# Patient Record
Sex: Female | Born: 1955 | Race: White | Hispanic: No | Smoking: Former smoker
Health system: Southern US, Community
[De-identification: ages and names within clinical notes are randomized; demographics above are authoritative.]

## PROBLEM LIST (undated history)

## (undated) DIAGNOSIS — R42 Dizziness and giddiness: Secondary | ICD-10-CM

## (undated) DIAGNOSIS — R0602 Shortness of breath: Secondary | ICD-10-CM

## (undated) DIAGNOSIS — R112 Nausea with vomiting, unspecified: Secondary | ICD-10-CM

## (undated) DIAGNOSIS — K219 Gastro-esophageal reflux disease without esophagitis: Secondary | ICD-10-CM

## (undated) DIAGNOSIS — E7800 Pure hypercholesterolemia, unspecified: Secondary | ICD-10-CM

## (undated) DIAGNOSIS — R002 Palpitations: Secondary | ICD-10-CM

## (undated) DIAGNOSIS — F419 Anxiety disorder, unspecified: Secondary | ICD-10-CM

## (undated) DIAGNOSIS — G8929 Other chronic pain: Secondary | ICD-10-CM

## (undated) DIAGNOSIS — C9000 Multiple myeloma not having achieved remission: Secondary | ICD-10-CM

## (undated) DIAGNOSIS — M1990 Unspecified osteoarthritis, unspecified site: Secondary | ICD-10-CM

## (undated) DIAGNOSIS — M549 Dorsalgia, unspecified: Secondary | ICD-10-CM

## (undated) DIAGNOSIS — I10 Essential (primary) hypertension: Secondary | ICD-10-CM

## (undated) DIAGNOSIS — C801 Malignant (primary) neoplasm, unspecified: Secondary | ICD-10-CM

## (undated) DIAGNOSIS — Z9889 Other specified postprocedural states: Secondary | ICD-10-CM

## (undated) DIAGNOSIS — D649 Anemia, unspecified: Secondary | ICD-10-CM

## (undated) DIAGNOSIS — R000 Tachycardia, unspecified: Secondary | ICD-10-CM

## (undated) DIAGNOSIS — Z780 Asymptomatic menopausal state: Secondary | ICD-10-CM

## (undated) DIAGNOSIS — E669 Obesity, unspecified: Secondary | ICD-10-CM

## (undated) DIAGNOSIS — G709 Myoneural disorder, unspecified: Secondary | ICD-10-CM

## (undated) DIAGNOSIS — Z5189 Encounter for other specified aftercare: Secondary | ICD-10-CM

## (undated) HISTORY — PX: SPINE SURGERY: SHX786

## (undated) HISTORY — PX: TONSILLECTOMY: SHX5217

## (undated) HISTORY — PX: TONSILLECTOMY: SUR1361

## (undated) HISTORY — DX: Dorsalgia, unspecified: M54.9

## (undated) HISTORY — DX: Encounter for other specified aftercare: Z51.89

## (undated) HISTORY — PX: BACK SURGERY: SHX140

## (undated) HISTORY — PX: CHOLECYSTECTOMY: SHX55

## (undated) HISTORY — PX: ABDOMINAL HYSTERECTOMY: SHX81

## (undated) HISTORY — DX: Pure hypercholesterolemia, unspecified: E78.00

## (undated) HISTORY — DX: Obesity, unspecified: E66.9

## (undated) HISTORY — PX: OTHER SURGICAL HISTORY: SHX169

## (undated) HISTORY — DX: Anemia, unspecified: D64.9

## (undated) HISTORY — DX: Palpitations: R00.2

## (undated) HISTORY — DX: Asymptomatic menopausal state: Z78.0

## (undated) HISTORY — DX: Tachycardia, unspecified: R00.0

## (undated) HISTORY — DX: Myoneural disorder, unspecified: G70.9

## (undated) HISTORY — DX: Essential (primary) hypertension: I10

## (undated) HISTORY — PX: PARTIAL HYSTERECTOMY: SHX80

## (undated) HISTORY — DX: Dizziness and giddiness: R42

## (undated) HISTORY — DX: Other chronic pain: G89.29

## (undated) HISTORY — DX: Shortness of breath: R06.02

---

## 2001-01-13 ENCOUNTER — Encounter: Payer: Self-pay | Admitting: *Deleted

## 2001-01-13 ENCOUNTER — Encounter: Admission: RE | Admit: 2001-01-13 | Discharge: 2001-01-13 | Payer: Self-pay | Admitting: *Deleted

## 2001-07-21 ENCOUNTER — Encounter: Payer: Self-pay | Admitting: *Deleted

## 2001-07-21 ENCOUNTER — Encounter: Admission: RE | Admit: 2001-07-21 | Discharge: 2001-07-21 | Payer: Self-pay | Admitting: *Deleted

## 2001-11-18 ENCOUNTER — Encounter: Payer: Self-pay | Admitting: *Deleted

## 2001-11-18 ENCOUNTER — Encounter: Admission: RE | Admit: 2001-11-18 | Discharge: 2001-11-18 | Payer: Self-pay | Admitting: *Deleted

## 2002-08-24 ENCOUNTER — Encounter: Payer: Self-pay | Admitting: Oncology

## 2002-08-24 ENCOUNTER — Encounter: Admission: RE | Admit: 2002-08-24 | Discharge: 2002-08-24 | Payer: Self-pay | Admitting: Oncology

## 2003-03-01 ENCOUNTER — Encounter: Admission: RE | Admit: 2003-03-01 | Discharge: 2003-03-01 | Payer: Self-pay | Admitting: Oncology

## 2003-03-28 ENCOUNTER — Ambulatory Visit (HOSPITAL_COMMUNITY): Admission: RE | Admit: 2003-03-28 | Discharge: 2003-03-28 | Payer: Self-pay | Admitting: Gastroenterology

## 2004-06-27 ENCOUNTER — Encounter: Admission: RE | Admit: 2004-06-27 | Discharge: 2004-06-27 | Payer: Self-pay | Admitting: Family Medicine

## 2005-07-16 ENCOUNTER — Encounter: Admission: RE | Admit: 2005-07-16 | Discharge: 2005-07-16 | Payer: Self-pay | Admitting: Family Medicine

## 2005-09-09 ENCOUNTER — Encounter: Admission: RE | Admit: 2005-09-09 | Discharge: 2005-09-09 | Payer: Self-pay | Admitting: Internal Medicine

## 2005-12-15 ENCOUNTER — Encounter: Admission: RE | Admit: 2005-12-15 | Discharge: 2005-12-15 | Payer: Self-pay | Admitting: Family Medicine

## 2006-08-11 ENCOUNTER — Encounter: Admission: RE | Admit: 2006-08-11 | Discharge: 2006-08-11 | Payer: Self-pay | Admitting: Family Medicine

## 2007-01-12 ENCOUNTER — Encounter: Admission: RE | Admit: 2007-01-12 | Discharge: 2007-01-12 | Payer: Self-pay | Admitting: Family Medicine

## 2007-10-06 ENCOUNTER — Encounter: Admission: RE | Admit: 2007-10-06 | Discharge: 2007-10-06 | Payer: Self-pay | Admitting: Family Medicine

## 2008-07-07 ENCOUNTER — Encounter
Admission: RE | Admit: 2008-07-07 | Discharge: 2008-07-07 | Payer: Self-pay | Admitting: Physical Medicine and Rehabilitation

## 2008-07-07 ENCOUNTER — Emergency Department (HOSPITAL_COMMUNITY): Admission: EM | Admit: 2008-07-07 | Discharge: 2008-07-07 | Payer: Self-pay | Admitting: Emergency Medicine

## 2008-08-02 ENCOUNTER — Ambulatory Visit (HOSPITAL_COMMUNITY): Admission: AD | Admit: 2008-08-02 | Discharge: 2008-08-04 | Payer: Self-pay | Admitting: Neurosurgery

## 2008-08-16 ENCOUNTER — Encounter: Admission: RE | Admit: 2008-08-16 | Discharge: 2008-08-16 | Payer: Self-pay | Admitting: Neurosurgery

## 2008-10-18 ENCOUNTER — Encounter: Admission: RE | Admit: 2008-10-18 | Discharge: 2008-10-18 | Payer: Self-pay | Admitting: Internal Medicine

## 2008-10-18 IMAGING — US US RENAL
1 series · 14 of 25 positions shown · non-contrast
Comparison: MRI of the lumbar spine of [DATE]

CLINICAL DATA: Follow up of renal lesions noted on MR lumbar spine
of [DATE]

RENAL/URINARY TRACT ULTRASOUND COMPLETE

[Series 1: us renal · 0.24mm/px · 14 of 37 slices shown]
[im 1/37]
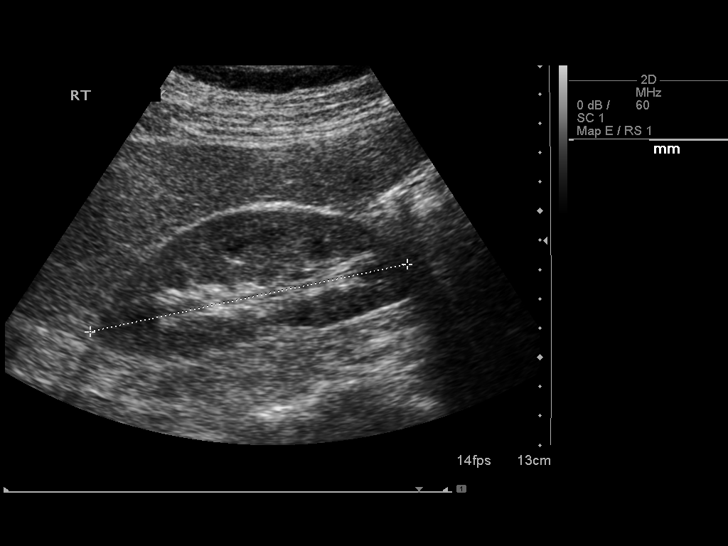
[im 4/37]
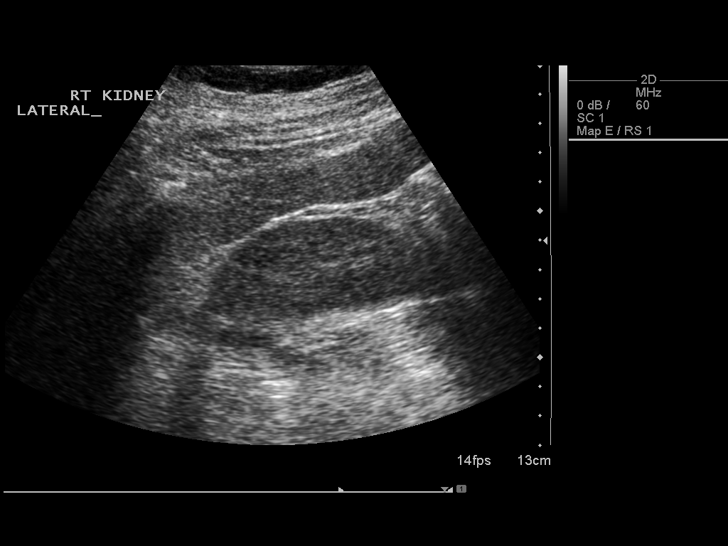
[im 7/37]
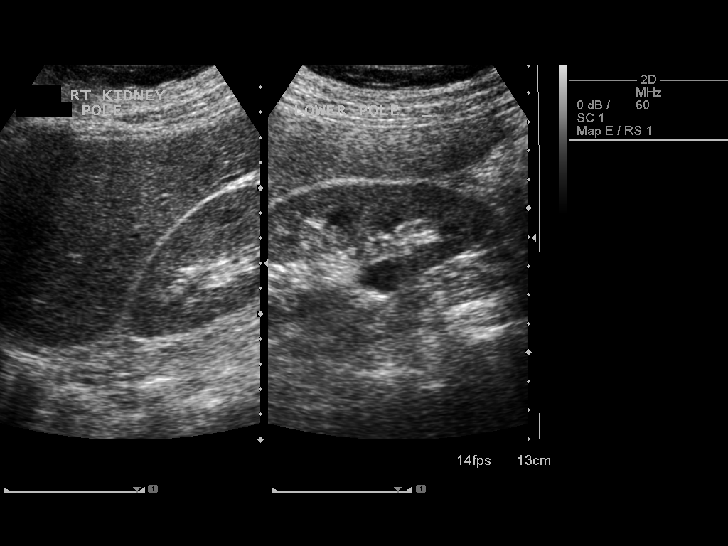
[im 10/37]
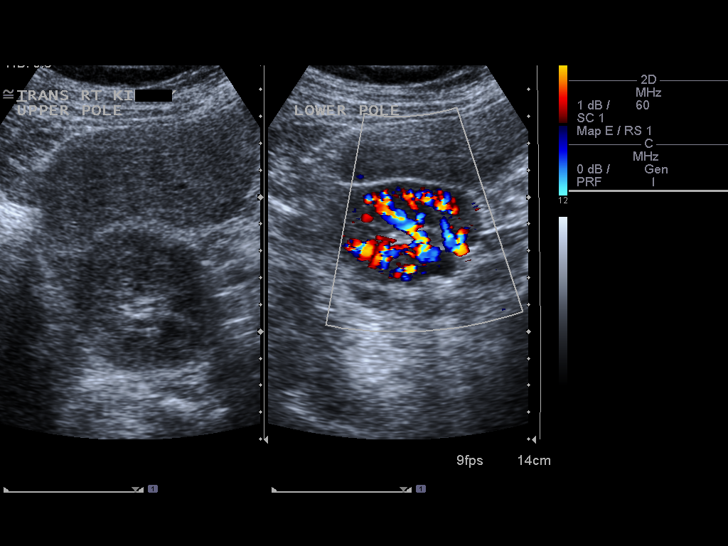
[im 13/37]
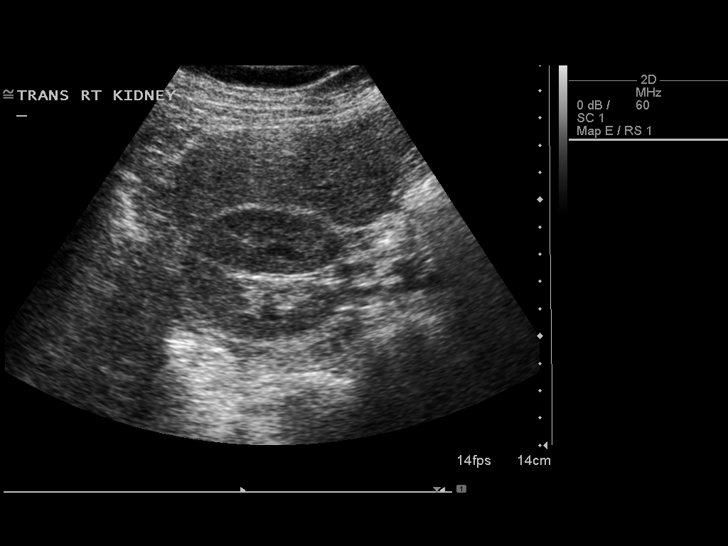
[im 14/37]
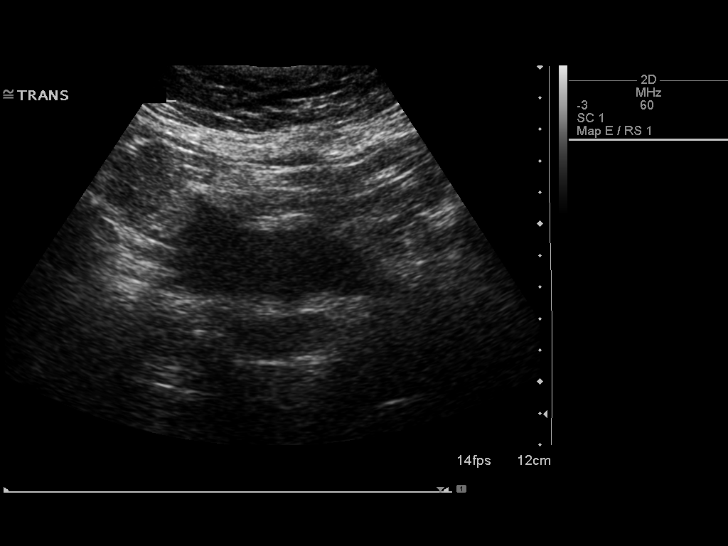
[im 17/37]
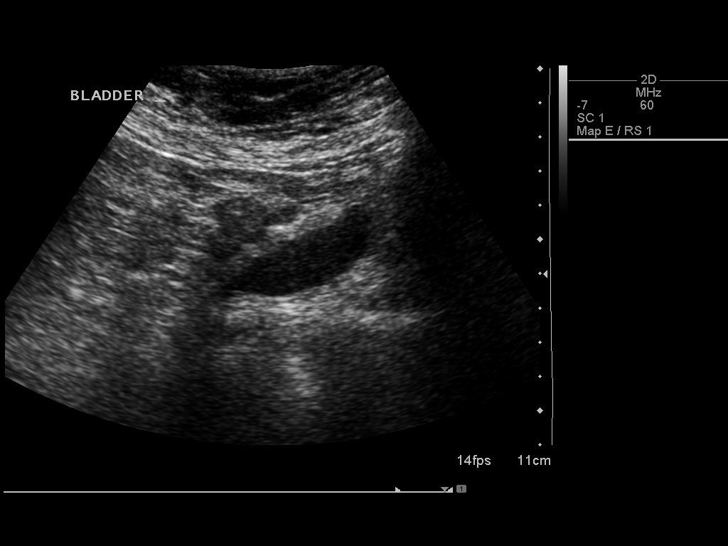
[im 20/37]
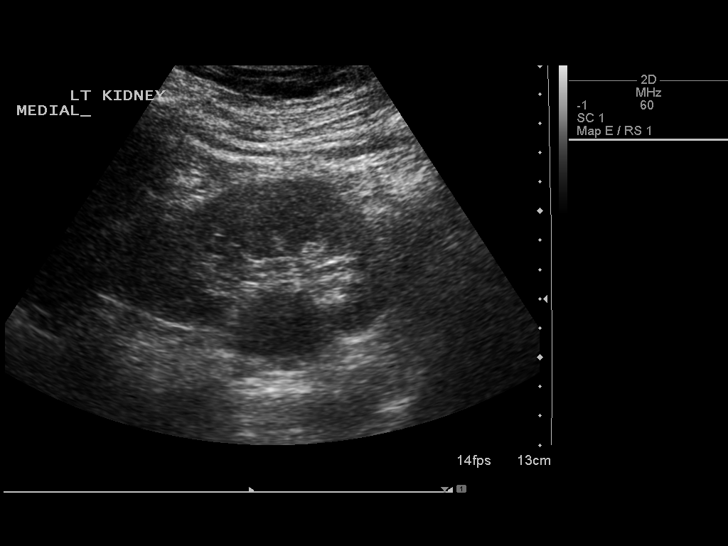
[im 23/37]
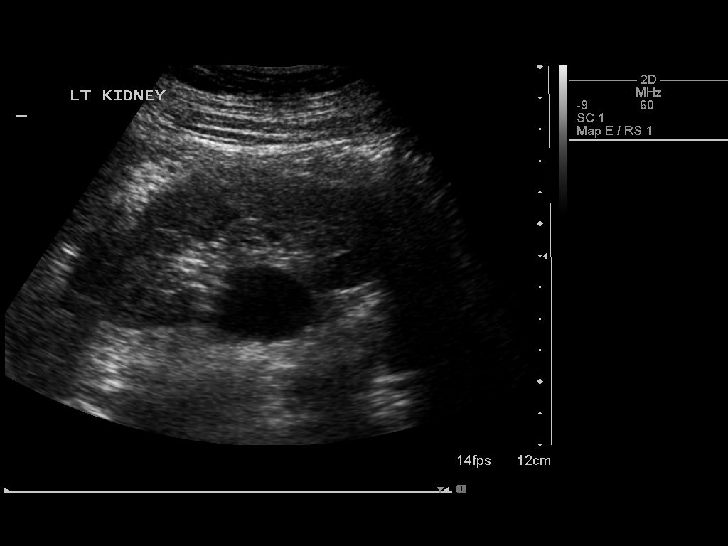
[im 25/37]
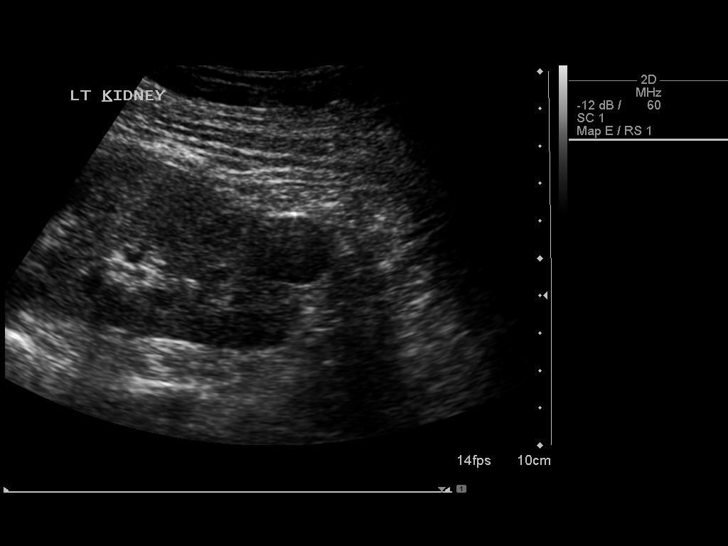
[im 28/37]
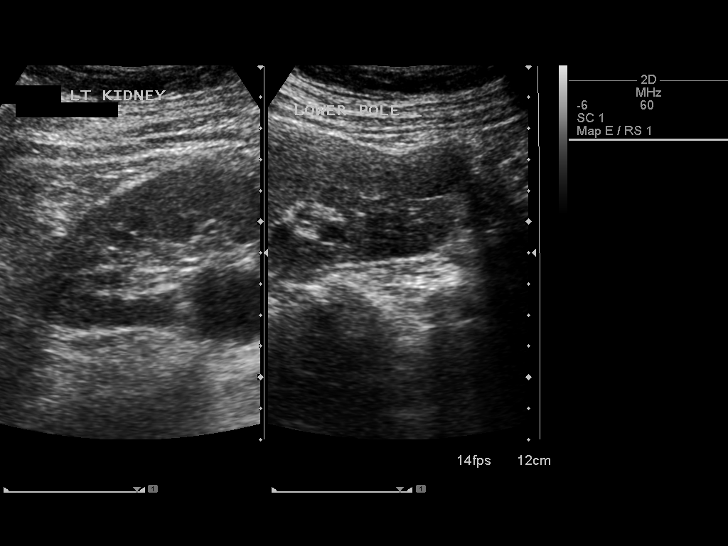
[im 31/37]
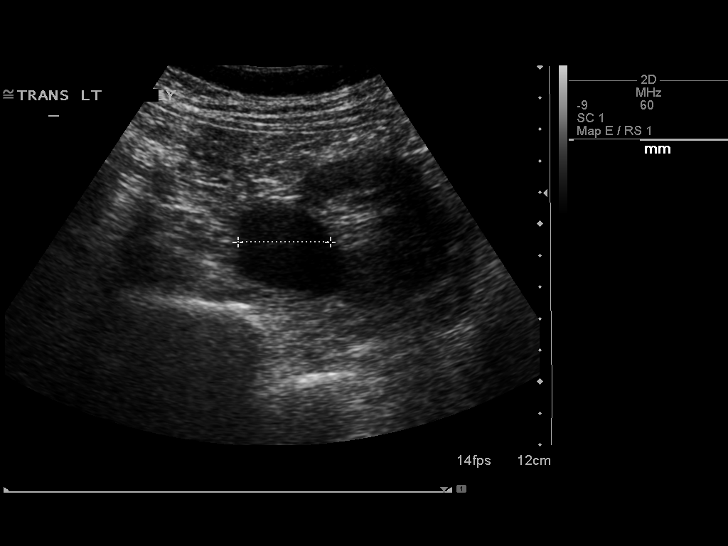
[im 34/37]
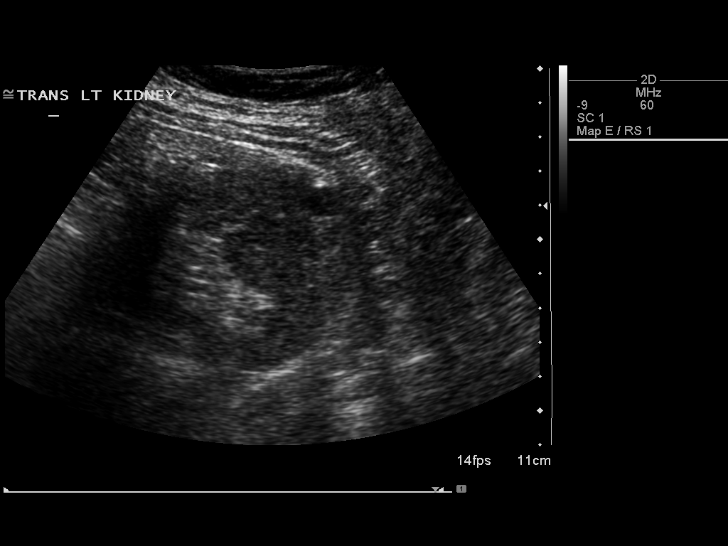
[im 37/37]
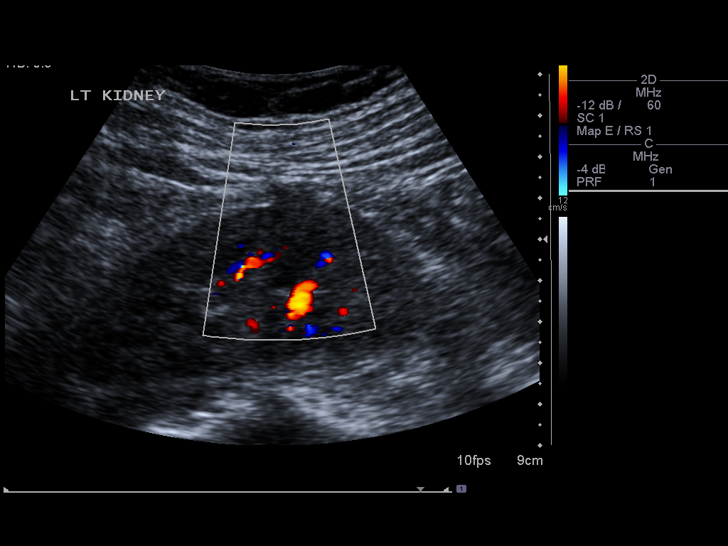

[14 of 25 positions shown; findings below may reference images not displayed]

FINDINGS: Right Kidney:  No hydronephrosis is seen.  The right kidney
measures 11.1 cm sagittally.  A small cyst is noted in the mid to
lower right kidney of 1.4 x 1.1 x 1.0 cm.

Left Kidney:  No hydronephrosis is noted and the left kidney
measures 9.9 cm sagittally.  There is a cyst in the midportion
medially of 3.3 x 2.5 x 2.9 cm.  A cyst in the mid kidney is
slightly exophytic measuring 1.1 x 0.9 x 1.2 cm.  A cyst in the
lower pole appears simple measuring 2.2 x 1.9 x 1.7 cm.  No solid
renal lesion is seen

Bladder:  The urinary bladder is not well distended but is
unremarkable.
IMPRESSION: 1.  The abnormalities noted in the kidneys on MRI of the lumbar
spine correlate with renal cysts by ultrasound.  No solid renal
lesion is seen.
 2.  No hydronephrosis.

## 2008-12-26 ENCOUNTER — Encounter: Admission: RE | Admit: 2008-12-26 | Discharge: 2008-12-26 | Payer: Self-pay | Admitting: Internal Medicine

## 2009-01-01 ENCOUNTER — Encounter
Admission: RE | Admit: 2009-01-01 | Discharge: 2009-01-01 | Payer: Self-pay | Admitting: Physical Medicine and Rehabilitation

## 2009-01-10 ENCOUNTER — Inpatient Hospital Stay (HOSPITAL_COMMUNITY): Admission: RE | Admit: 2009-01-10 | Discharge: 2009-01-14 | Payer: Self-pay | Admitting: Neurosurgery

## 2010-05-11 ENCOUNTER — Other Ambulatory Visit: Payer: Self-pay | Admitting: Family Medicine

## 2010-05-11 DIAGNOSIS — Z1239 Encounter for other screening for malignant neoplasm of breast: Secondary | ICD-10-CM

## 2010-05-12 ENCOUNTER — Encounter: Payer: Self-pay | Admitting: Family Medicine

## 2010-06-10 ENCOUNTER — Ambulatory Visit
Admission: RE | Admit: 2010-06-10 | Discharge: 2010-06-10 | Disposition: A | Discharge status: Home / Self Care | Payer: BC Managed Care – PPO | Source: Ambulatory Visit | Attending: *Deleted | Admitting: *Deleted

## 2010-06-10 DIAGNOSIS — Z1239 Encounter for other screening for malignant neoplasm of breast: Secondary | ICD-10-CM

## 2010-06-11 ENCOUNTER — Emergency Department (HOSPITAL_COMMUNITY): Payer: BC Managed Care – PPO

## 2010-06-11 ENCOUNTER — Emergency Department (HOSPITAL_COMMUNITY)
Admission: EM | Admit: 2010-06-11 | Discharge: 2010-06-11 | Disposition: A | Discharge status: Home / Self Care | Payer: BC Managed Care – PPO | Attending: Emergency Medicine | Admitting: Emergency Medicine

## 2010-06-11 DIAGNOSIS — G8929 Other chronic pain: Secondary | ICD-10-CM | POA: Insufficient documentation

## 2010-06-11 DIAGNOSIS — I498 Other specified cardiac arrhythmias: Secondary | ICD-10-CM | POA: Insufficient documentation

## 2010-06-11 DIAGNOSIS — M546 Pain in thoracic spine: Secondary | ICD-10-CM | POA: Insufficient documentation

## 2010-06-11 DIAGNOSIS — R0789 Other chest pain: Secondary | ICD-10-CM | POA: Insufficient documentation

## 2010-06-21 ENCOUNTER — Other Ambulatory Visit: Payer: Self-pay | Admitting: Family Medicine

## 2010-06-21 DIAGNOSIS — N281 Cyst of kidney, acquired: Secondary | ICD-10-CM

## 2010-07-08 ENCOUNTER — Ambulatory Visit
Admission: RE | Admit: 2010-07-08 | Discharge: 2010-07-08 | Disposition: A | Discharge status: Home / Self Care | Payer: BC Managed Care – PPO | Source: Ambulatory Visit | Attending: Family Medicine | Admitting: Family Medicine

## 2010-07-08 DIAGNOSIS — N281 Cyst of kidney, acquired: Secondary | ICD-10-CM

## 2010-07-26 LAB — CBC
Hemoglobin: 13.4 g/dL (ref 12.0–15.0)
MCV: 97.2 fL (ref 78.0–100.0)
RBC: 3.96 MIL/uL (ref 3.87–5.11)

## 2010-07-26 LAB — BASIC METABOLIC PANEL
BUN: 10 mg/dL (ref 6–23)
Calcium: 9.7 mg/dL (ref 8.4–10.5)
Chloride: 104 mEq/L (ref 96–112)
Creatinine, Ser: 0.66 mg/dL (ref 0.4–1.2)
Glucose, Bld: 97 mg/dL (ref 70–99)
Potassium: 4.3 mEq/L (ref 3.5–5.1)
Sodium: 138 mEq/L (ref 135–145)

## 2010-07-31 LAB — BASIC METABOLIC PANEL
BUN: 11 mg/dL (ref 6–23)
GFR calc non Af Amer: >60 mL/min (ref >60)
Potassium: 4.6 mEq/L (ref 3.5–5.1)
Sodium: 133 mEq/L — ABNORMAL LOW (ref 135–145)

## 2010-07-31 LAB — CBC
MCHC: 33.7 g/dL (ref 30.0–36.0)
Platelets: 219 10*3/uL (ref 150–400)
RBC: 4.06 MIL/uL (ref 3.87–5.11)
RDW: 13.4 % (ref 11.5–15.5)
WBC: 7.8 10*3/uL (ref 4.0–10.5)

## 2010-08-29 ENCOUNTER — Other Ambulatory Visit: Payer: Self-pay | Admitting: *Deleted

## 2010-08-29 ENCOUNTER — Other Ambulatory Visit: Payer: Self-pay | Admitting: Family Medicine

## 2010-08-29 DIAGNOSIS — Z1239 Encounter for other screening for malignant neoplasm of breast: Secondary | ICD-10-CM

## 2010-09-03 NOTE — Op Note (Signed)
Anna Castillo, Anna Castillo                ACCOUNT NO.:  [id number]   MEDICAL RECORD NO.:  [id number]          PATIENT TYPE:  INP   LOCATION:  3037                         FACILITY:  [facility]   PHYSICIAN:  Hilda Lias, M.D.   DATE OF BIRTH:  [birth year]   DATE OF PROCEDURE:  08/02/2008  DATE OF DISCHARGE:                               OPERATIVE REPORT   PREOPERATIVE DIAGNOSES:  Left L4-L5 herniated disk, right L3-4 herniated  disk.   POSTOPERATIVE DIAGNOSES:  Left L4-L5 herniated disk, right L3-4  herniated disk.   PROCEDURE:  Left L4-5 diskectomy and foraminotomy, right L3-L4  diskectomy and foraminotomy, microscope.   SURGEON:  Hilda Lias, MD.   ASSISTANT:  Hewitt Shorts, MD.   CLINICAL HISTORY:  The patient was admitted because of back pain with  radiating bilaterally.  On the left side, she has a herniated disk at L4-  5 as well as at L3-4 on the right side.  She had failed conservative  treatment.  Surgery was advised.   PROCEDURE:  The patient was taken to the OR and she was positioned in  prone manner.  The wound was cleaned with DuraPrep.  X-rays showed that  we were at the L4-5 level.  Incision from L3-L5 was made in the lower  part to the left and the upper part to the  right side.  The x-rays  showed that indeed we were at the level of L4-5.  We brought the  microscope into the area.  Once we had good retraction, we found the  canal was quite vertical.  Then with a drill, we drilled the lower  lamina of L4 and the upper of L5 and the thick yellow ligament was also  excised.  Retraction was made of the thecal sac and the L5 nerve root  was swollen and displaced.  There was a  large herniated disk going to  the body of L5.  Incision was made and free fragment was removed.  We  entered disk space and total gross diskectomy was achieved.  At the end,  we had good decompression after we did a foraminotomy.  At the level of  L3-4, one x-ray showed that we were at L2-3.   We dropped down 1 level at  the level of L3-4.  Again, the canal was vertical and one of the facets  was  loose which had to be removed.  Then, it was easy to get into the  disk space after we opened the yellow ligament.  There was a herniated  disk affecting the takeoff of L4.  Incision was made and a large amount  degenerative disk medial and lateral was achieved.  Investigation of L3  was normal.  At the end, we had good decompression of L3-4.  The area  was irrigated.  Valsalva maneuver was negative on both sides  and  fentanyl and Depo-Medrol were left in the epidural space and the wound  was closed with Vicryl and Steri-Strips.           ______________________________  Hilda Lias, M.D.  EB/MEDQ  D:  08/02/2008  T:  08/03/2008  Job:  [id number]

## 2010-09-06 NOTE — Op Note (Signed)
NAME:  Anna Castillo, Anna Castillo                          ACCOUNT NO.:  [id number]   MEDICAL RECORD NO.:  [id number]                   PATIENT TYPE:  AMB   LOCATION:  ENDO                                 FACILITY:  [facility]   PHYSICIAN:  Anselmo Rod, M.D.               DATE OF BIRTH:  08-Jun-1955   DATE OF PROCEDURE:  03/28/2003  DATE OF DISCHARGE:                                 OPERATIVE REPORT   PROCEDURE PERFORMED:  Colonoscopy.   ENDOSCOPIST:  Anselmo Rod, M.D.   INSTRUMENT USED:  Olympus videocolonoscope.   INDICATIONS FOR PROCEDURE:  This is a 55 year old white female with a  history of change in bowel habits, severe constipation, occasional BRBPR.  Rule out colonic polyps, masses, etc.   PREPROCEDURE PREPARATION:  Informed consent was secured from the patient.  The patient fasted for eight hours prior to the procedure and prepped with a  bottle of magnesium citrate and one gallon of GoLYTELY the night prior to  the procedure.   PREPROCEDURE PHYSICAL:  VITAL SIGNS:  Stable.  NECK:  Supple.  CHEST:  Clear to auscultation.  HEART:  S1, S2 regular.  ABDOMEN:  Soft, with normal bowel sounds.   DESCRIPTION OF THE PROCEDURE:  The patient was placed in the left lateral  decubitus position and sedated with 100 mg of Demerol and 10 mg of Versed in  slow incremental doses.  Once the patient was adequately sedated and  maintained on low-flow oxygen and continuous cardiac monitor, the Olympus  videocolonoscope was advanced from the rectum to the cecum with difficulty.  There was a large amount of residual stool in the colon, especially in the  dependent areas of the colon.  Multiple washings were done.  Small internal  hemorrhoids were seen on retroflexion of the rectum.  The rest of the  colonic mucosa of the terminal ileum appeared normal.  Small lesions could  have been missed.  The TI appeared healthy and without lesions.   IMPRESSION:  1. Normal colonoscopy up to the cecum  including the terminal ileum except     for small internal and external hemorrhoids.  2. Large amount of residual stool in the dependent areas of the colon.     Small lesions could have been missed.   RECOMMENDATIONS:  1. Trial of Zelnorm has been recommended for the patient, 6 mg b.i.d.  Four     weeks' worth of samples have been given to her from the office.  2. Repeat CRC screening in the next five years unless the patient develops     any abdominal symptoms in the interim.  3. Outpatient follow-up in the next four weeks for further recommendations.  Anselmo Rod, M.D.    JNM/MEDQ  D:  03/28/2003  T:  03/29/2003  Job:  [id number]   cc:   Teena Irani. Arlyce Dice, M.D.  [address]  [place]  [place] [address]  Fax: [phone]

## 2011-06-30 ENCOUNTER — Encounter: Payer: Self-pay | Admitting: *Deleted

## 2011-11-13 ENCOUNTER — Other Ambulatory Visit: Payer: Self-pay | Admitting: Family Medicine

## 2011-11-13 DIAGNOSIS — Z1231 Encounter for screening mammogram for malignant neoplasm of breast: Secondary | ICD-10-CM

## 2011-12-01 ENCOUNTER — Ambulatory Visit
Admission: RE | Admit: 2011-12-01 | Discharge: 2011-12-01 | Disposition: A | Discharge status: Home / Self Care | Payer: BC Managed Care – PPO | Source: Ambulatory Visit | Attending: Family Medicine | Admitting: Family Medicine

## 2011-12-01 DIAGNOSIS — Z1231 Encounter for screening mammogram for malignant neoplasm of breast: Secondary | ICD-10-CM

## 2012-11-05 ENCOUNTER — Other Ambulatory Visit: Payer: Self-pay | Admitting: Family Medicine

## 2012-11-05 DIAGNOSIS — R748 Abnormal levels of other serum enzymes: Secondary | ICD-10-CM

## 2012-11-11 ENCOUNTER — Other Ambulatory Visit: Payer: Self-pay

## 2012-11-11 DIAGNOSIS — Z1231 Encounter for screening mammogram for malignant neoplasm of breast: Secondary | ICD-10-CM

## 2012-11-12 ENCOUNTER — Other Ambulatory Visit: Payer: Self-pay | Admitting: Family Medicine

## 2012-11-12 DIAGNOSIS — M8580 Other specified disorders of bone density and structure, unspecified site: Secondary | ICD-10-CM

## 2012-11-18 ENCOUNTER — Other Ambulatory Visit: Payer: BC Managed Care – PPO

## 2012-12-01 ENCOUNTER — Ambulatory Visit
Admission: RE | Admit: 2012-12-01 | Discharge: 2012-12-01 | Disposition: A | Discharge status: Home / Self Care | Payer: BC Managed Care – PPO | Source: Ambulatory Visit

## 2012-12-01 ENCOUNTER — Ambulatory Visit
Admission: RE | Admit: 2012-12-01 | Discharge: 2012-12-01 | Disposition: A | Discharge status: Home / Self Care | Payer: BC Managed Care – PPO | Source: Ambulatory Visit | Attending: Family Medicine | Admitting: Family Medicine

## 2012-12-01 DIAGNOSIS — M8580 Other specified disorders of bone density and structure, unspecified site: Secondary | ICD-10-CM

## 2012-12-01 DIAGNOSIS — Z1231 Encounter for screening mammogram for malignant neoplasm of breast: Secondary | ICD-10-CM

## 2012-12-08 ENCOUNTER — Ambulatory Visit
Admission: RE | Admit: 2012-12-08 | Discharge: 2012-12-08 | Disposition: A | Discharge status: Home / Self Care | Payer: BC Managed Care – PPO | Source: Ambulatory Visit | Attending: Family Medicine | Admitting: Family Medicine

## 2012-12-08 DIAGNOSIS — R748 Abnormal levels of other serum enzymes: Secondary | ICD-10-CM

## 2013-12-28 ENCOUNTER — Other Ambulatory Visit: Payer: Self-pay

## 2013-12-28 DIAGNOSIS — Z1231 Encounter for screening mammogram for malignant neoplasm of breast: Secondary | ICD-10-CM

## 2014-01-17 ENCOUNTER — Encounter (INDEPENDENT_AMBULATORY_CARE_PROVIDER_SITE_OTHER): Payer: Self-pay

## 2014-01-17 ENCOUNTER — Ambulatory Visit
Admission: RE | Admit: 2014-01-17 | Discharge: 2014-01-17 | Disposition: A | Discharge status: Home / Self Care | Payer: BC Managed Care – PPO | Source: Ambulatory Visit

## 2014-01-17 DIAGNOSIS — Z1231 Encounter for screening mammogram for malignant neoplasm of breast: Secondary | ICD-10-CM

## 2014-01-18 ENCOUNTER — Telehealth (INDEPENDENT_AMBULATORY_CARE_PROVIDER_SITE_OTHER): Payer: Self-pay

## 2014-01-18 NOTE — Telephone Encounter (Signed)
Pt seen in office today by Dr Lucia Gaskins for initial consult for Lap Band and orders placed in epic.

## 2014-03-02 ENCOUNTER — Ambulatory Visit: Payer: BC Managed Care – PPO | Admitting: Dietician

## 2014-04-25 ENCOUNTER — Encounter: Payer: BLUE CROSS/BLUE SHIELD | Attending: Surgery | Admitting: Dietician

## 2014-04-25 ENCOUNTER — Encounter: Payer: Self-pay | Admitting: Dietician

## 2014-04-25 VITALS — Ht 64.0 in | Wt 224.6 lb

## 2014-04-25 DIAGNOSIS — Z6838 Body mass index (BMI) 38.0-38.9, adult: Secondary | ICD-10-CM | POA: Diagnosis not present

## 2014-04-25 DIAGNOSIS — E669 Obesity, unspecified: Secondary | ICD-10-CM | POA: Diagnosis present

## 2014-04-25 DIAGNOSIS — Z713 Dietary counseling and surveillance: Secondary | ICD-10-CM | POA: Diagnosis not present

## 2014-04-25 NOTE — Progress Notes (Signed)
  Pre-Op Assessment Visit:  Pre-Operative LAGB Surgery  Medical Nutrition Therapy:  Appt start time: 400   End time:  430  Patient was seen on 04/25/14 for Pre-Operative Nutrition Assessment. Assessment and letter of approval faxed to [facility] Bariatric Surgery Program coordinator on 04/25/14.   Preferred Learning Style:   No preference indicated   Learning Readiness:   Ready  Handouts given during visit include:  Pre-Op Goals Bariatric Surgery Protein Shakes   During the appointment today the following Pre-Op Goals were reviewed with the patient: Maintain or lose weight as instructed by your surgeon Make healthy food choices Begin to limit portion sizes Limited concentrated sugars and fried foods Keep fat/sugar in the single digits per serving on   food labels Practice CHEWING your food  (aim for 30 chews per bite or until applesauce consistency) Practice not drinking 15 minutes before, during, and 30 minutes after each meal/snack Avoid all carbonated beverages  Avoid/limit caffeinated beverages  Avoid all sugar-sweetened beverages Consume 3 meals per day; eat every 3-5 hours Make a list of non-food related activities Aim for 64-100 ounces of FLUID daily  Aim for at least 60-80 grams of PROTEIN daily Look for a liquid protein source that contain ?15 g protein and ?5 g carbohydrate  (ex: shakes, drinks, shots)  Patient-Centered Goals: Size 12, comfortable and healthy  Demonstrated degree of understanding via:  Teach Back  Teaching Method Utilized:  Visual Auditory Hands on  Barriers to learning/adherence to lifestyle change: none  Patient to call the [facility] to enroll in Pre-Op and Post-Op Nutrition Education when surgery date is scheduled.

## 2014-04-25 NOTE — Patient Instructions (Signed)
Follow Pre-Op Goals Try Protein Shakes Call [facility] at [phone] when surgery is scheduled to enroll in Pre-Op Class  Things to remember:  Please always be honest with us. We want to support you!  If you have any questions or concerns in between appointments, please call or email Liz, Ganon Demasi, or Laurie.  The diet after surgery will be high protein and low in carbohydrate.  Vitamins and calcium need to be taken for the rest of your life.  Feel free to include support people in any classes or appointments.  

## 2014-04-26 LAB — CBC WITH DIFFERENTIAL/PLATELET
BASOS ABS: 0.1 10*3/uL (ref 0.0–0.1)
BASOS PCT: 1 % (ref 0–1)
EOS ABS: 0.1 10*3/uL (ref 0.0–0.7)
Eosinophils Relative: 2 % (ref 0–5)
HCT: 36.3 % (ref 36.0–46.0)
Hemoglobin: 12.3 g/dL (ref 12.0–15.0)
LYMPHS ABS: 2.2 10*3/uL (ref 0.7–4.0)
LYMPHS PCT: 39 % (ref 12–46)
MCH: 32.5 pg (ref 26.0–34.0)
MCHC: 33.9 g/dL (ref 30.0–36.0)
MCV: 96 fL (ref 78.0–100.0)
MONOS PCT: 10 % (ref 3–12)
Monocytes Absolute: 0.6 10*3/uL (ref 0.1–1.0)
NEUTROS ABS: 2.7 10*3/uL (ref 1.7–7.7)
NEUTROS PCT: 48 % (ref 43–77)
PLATELETS: 315 10*3/uL (ref 150–400)
RBC: 3.78 MIL/uL — AB (ref 3.87–5.11)
RDW: 12.6 % (ref 11.5–15.5)
WBC: 5.6 10*3/uL (ref 4.0–10.5)

## 2014-04-26 LAB — COMPREHENSIVE METABOLIC PANEL
ALK PHOS: 108 U/L (ref 39–117)
ALT: 37 U/L — ABNORMAL HIGH (ref 0–35)
AST: 28 U/L (ref 0–37)
Albumin: 4.2 g/dL (ref 3.5–5.2)
BILIRUBIN TOTAL: 0.3 mg/dL (ref 0.2–1.2)
BUN: 17 mg/dL (ref 6–23)
CALCIUM: 9.2 mg/dL (ref 8.4–10.5)
CO2: 29 meq/L (ref 19–32)
Chloride: 100 mEq/L (ref 96–112)
Creat: 0.73 mg/dL (ref 0.50–1.10)
Glucose, Bld: 91 mg/dL (ref 70–99)
Potassium: 3.8 mEq/L (ref 3.5–5.3)
SODIUM: 136 meq/L (ref 135–145)
Total Protein: 7.5 g/dL (ref 6.0–8.3)

## 2014-04-26 LAB — TSH: TSH: 2.616 u[IU]/mL (ref 0.350–4.500)

## 2014-05-01 ENCOUNTER — Other Ambulatory Visit (INDEPENDENT_AMBULATORY_CARE_PROVIDER_SITE_OTHER): Payer: Self-pay | Admitting: Surgery

## 2014-05-02 ENCOUNTER — Ambulatory Visit (HOSPITAL_COMMUNITY)
Admission: RE | Admit: 2014-05-02 | Discharge: 2014-05-02 | Disposition: A | Discharge status: Home / Self Care | Payer: BLUE CROSS/BLUE SHIELD | Source: Ambulatory Visit | Attending: Surgery | Admitting: Surgery

## 2014-05-02 ENCOUNTER — Encounter (HOSPITAL_COMMUNITY)
Admission: RE | Disposition: A | Discharge status: Home / Self Care | Payer: Self-pay | Source: Ambulatory Visit | Attending: Surgery

## 2014-05-02 ENCOUNTER — Other Ambulatory Visit: Payer: Self-pay

## 2014-05-02 DIAGNOSIS — Z880 Allergy status to penicillin: Secondary | ICD-10-CM | POA: Diagnosis not present

## 2014-05-02 DIAGNOSIS — K219 Gastro-esophageal reflux disease without esophagitis: Secondary | ICD-10-CM | POA: Diagnosis not present

## 2014-05-02 DIAGNOSIS — Z6838 Body mass index (BMI) 38.0-38.9, adult: Secondary | ICD-10-CM | POA: Diagnosis not present

## 2014-05-02 DIAGNOSIS — Z885 Allergy status to narcotic agent status: Secondary | ICD-10-CM | POA: Diagnosis not present

## 2014-05-02 DIAGNOSIS — K224 Dyskinesia of esophagus: Secondary | ICD-10-CM | POA: Insufficient documentation

## 2014-05-02 DIAGNOSIS — Z9049 Acquired absence of other specified parts of digestive tract: Secondary | ICD-10-CM | POA: Diagnosis not present

## 2014-05-02 HISTORY — PX: BREATH TEK H PYLORI: SHX5422

## 2014-05-02 SURGERY — BREATH TEST, FOR HELICOBACTER PYLORI

## 2014-05-02 NOTE — Progress Notes (Signed)
   05/02/14 [place]  Referring MD Dr Alphonsa Overall  Time of Last PO Intake 2200  Baseline Breath At: 9373  Pranactin Given At: 4287  Post-Dose Breath At: 0809  Sample 1 2.4%  Sample 2 2.4%  Test Negative

## 2014-05-03 ENCOUNTER — Encounter (HOSPITAL_COMMUNITY): Payer: Self-pay | Admitting: Surgery

## 2014-05-15 ENCOUNTER — Ambulatory Visit: Payer: BLUE CROSS/BLUE SHIELD

## 2014-05-29 ENCOUNTER — Encounter: Payer: BLUE CROSS/BLUE SHIELD | Attending: Surgery

## 2014-05-29 DIAGNOSIS — E669 Obesity, unspecified: Secondary | ICD-10-CM | POA: Insufficient documentation

## 2014-05-29 DIAGNOSIS — Z713 Dietary counseling and surveillance: Secondary | ICD-10-CM | POA: Diagnosis not present

## 2014-05-29 DIAGNOSIS — Z6838 Body mass index (BMI) 38.0-38.9, adult: Secondary | ICD-10-CM | POA: Insufficient documentation

## 2014-05-31 NOTE — Progress Notes (Signed)
  Pre-Operative Nutrition Class:  Appt start time: 6237   End time:  1830.  Patient was seen on 05/29/14 for Pre-Operative Bariatric Surgery Education at the [facility].   Surgery date:  Surgery type: LAGB Start weight at [facility]: 224.5 lbs on 04/25/14 Weight today: 225.5 lbs  TANITA  BODY COMP RESULTS  05/29/14   BMI (kg/m^2) 38.7   Fat Mass (lbs) 108   Fat Free Mass (lbs) 117.5   Total Body Water (lbs) 86   Samples given per MNT protocol. Patient educated on appropriate usage: Premier protein shake (vanilla - qty 1) Lot #: [id number] Exp: 03/2015  Bariatric Advantage Calcium Citrate (caramel - qty 1) Lot #: [id number] Exp: 08/2014  Unjury protein powder (chicken soup - qty 1) Lot #: [id number] Exp: 07/2015  PB2 (qty 1) Lot #: [id number] Exp: 11/2014  The following the learning objectives were met by the patient during this course:  Identify Pre-Op Dietary Goals and will begin 2 weeks pre-operatively  Identify appropriate sources of fluids and proteins   State protein recommendations and appropriate sources pre and post-operatively  Identify Post-Operative Dietary Goals and will follow for 2 weeks post-operatively  Identify appropriate multivitamin and calcium sources  Describe the need for physical activity post-operatively and will follow MD recommendations  State when to call healthcare provider regarding medication questions or post-operative complications  Handouts given during class include:  Pre-Op Bariatric Surgery Diet Handout  Protein Shake Handout  Post-Op Bariatric Surgery Nutrition Handout  BELT Program Information Flyer  Support Group Information Flyer  [facility] Bariatric Supplements Price List  Follow-Up Plan: Patient will follow-up at [facility] 2 weeks post operatively for diet advancement per MD.

## 2014-06-15 ENCOUNTER — Other Ambulatory Visit (INDEPENDENT_AMBULATORY_CARE_PROVIDER_SITE_OTHER): Payer: Self-pay | Admitting: Surgery

## 2014-06-20 ENCOUNTER — Encounter (HOSPITAL_COMMUNITY)
Admission: RE | Admit: 2014-06-20 | Discharge: 2014-06-20 | Disposition: A | Discharge status: Home / Self Care | Payer: BLUE CROSS/BLUE SHIELD | Source: Ambulatory Visit | Attending: Surgery | Admitting: Surgery

## 2014-06-20 ENCOUNTER — Encounter (INDEPENDENT_AMBULATORY_CARE_PROVIDER_SITE_OTHER): Payer: Self-pay

## 2014-06-20 ENCOUNTER — Encounter (HOSPITAL_COMMUNITY): Payer: Self-pay

## 2014-06-20 DIAGNOSIS — Z01812 Encounter for preprocedural laboratory examination: Secondary | ICD-10-CM | POA: Insufficient documentation

## 2014-06-20 HISTORY — DX: Gastro-esophageal reflux disease without esophagitis: K21.9

## 2014-06-20 HISTORY — DX: Other specified postprocedural states: Z98.890

## 2014-06-20 HISTORY — DX: Unspecified osteoarthritis, unspecified site: M19.90

## 2014-06-20 HISTORY — DX: Nausea with vomiting, unspecified: R11.2

## 2014-06-20 HISTORY — DX: Anxiety disorder, unspecified: F41.9

## 2014-06-20 LAB — COMPREHENSIVE METABOLIC PANEL
ALBUMIN: 4.1 g/dL (ref 3.5–5.2)
ALT: 39 U/L — AB (ref 0–35)
AST: 33 U/L (ref 0–37)
Alkaline Phosphatase: 80 U/L (ref 39–117)
Anion gap: 8 (ref 5–15)
BUN: 18 mg/dL (ref 6–23)
CO2: 28 mmol/L (ref 19–32)
Calcium: 9.5 mg/dL (ref 8.4–10.5)
Chloride: 102 mmol/L (ref 96–112)
Creatinine, Ser: 0.72 mg/dL (ref 0.50–1.10)
GFR calc Af Amer: >90 mL/min (ref >90)
GFR calc non Af Amer: >90 mL/min (ref >90)
GLUCOSE: 96 mg/dL (ref 70–99)
POTASSIUM: 4 mmol/L (ref 3.5–5.1)
Sodium: 138 mmol/L (ref 135–145)
Total Bilirubin: 0.4 mg/dL (ref 0.3–1.2)
Total Protein: 7.8 g/dL (ref 6.0–8.3)

## 2014-06-20 LAB — CBC WITH DIFFERENTIAL/PLATELET
BASOS ABS: 0 10*3/uL (ref 0.0–0.1)
Basophils Relative: 1 % (ref 0–1)
Eosinophils Absolute: 0.1 10*3/uL (ref 0.0–0.7)
Eosinophils Relative: 2 % (ref 0–5)
HEMATOCRIT: 37.1 % (ref 36.0–46.0)
HEMOGLOBIN: 12.5 g/dL (ref 12.0–15.0)
LYMPHS PCT: 37 % (ref 12–46)
Lymphs Abs: 2 10*3/uL (ref 0.7–4.0)
MCH: 32.8 pg (ref 26.0–34.0)
MCHC: 33.7 g/dL (ref 30.0–36.0)
MCV: 97.4 fL (ref 78.0–100.0)
MONO ABS: 0.5 10*3/uL (ref 0.1–1.0)
Monocytes Relative: 8 % (ref 3–12)
Neutro Abs: 2.8 10*3/uL (ref 1.7–7.7)
Neutrophils Relative %: 52 % (ref 43–77)
Platelets: 210 10*3/uL (ref 150–400)
RBC: 3.81 MIL/uL — ABNORMAL LOW (ref 3.87–5.11)
RDW: 12.1 % (ref 11.5–15.5)
WBC: 5.4 10*3/uL (ref 4.0–10.5)

## 2014-06-20 NOTE — Progress Notes (Addendum)
EKG per epic 05/02/2014  CBC results/epic per PAT visit 06/20/2014 sent to Dr Lucia Gaskins

## 2014-06-20 NOTE — Progress Notes (Signed)
Your patient has screened at an elevated risk for Obstructive Sleep Apnea using the Stop-Bang Tool during a pre-surgical vist. A score of 4 or greater is an elevated risk. Score of 6. 

## 2014-06-20 NOTE — Patient Instructions (Addendum)
[place]  06/20/2014   Your procedure is scheduled on:        Tuesday June 27, 2014   Report to [facility] Main Entrance and follow signs to  [facility] arrive at [place] AM.   Call this number if you have problems the morning of surgery [phone] or Presurgical Testing [phone].   Remember:  Do not eat food or drink liquids :After Midnight.  For Living Will and/or Health Care Power Attorney Forms: please provide copy for your medical record, may bring AM of surgery (forms should be already notarized-we do not provide this service).   Take these medicines the morning of surgery with A SIP OF WATER: Alprazolam (Xanax) if needed;Omeprazole (Prilosec)                               You may not have any metal on your body including hair pins and piercings  Do not wear jewelry, make-up, lotions, powders,prefumes or deodorant.  Do not shave body hair  48 hours(2 days) of CHG soap use.                Do not bring valuables to the hospital. [place].  Contacts, dentures or bridgework may not be worn into surgery.  Leave suitcase in the car. After surgery it may be brought to your room.  For patients admitted to the hospital, checkout time is 11:00 AM the day of discharge.   ________________________________________________________________________  [facility] - Preparing for Surgery Before surgery, you can play an important role.  Because skin is not sterile, your skin needs to be as free of germs as possible.  You can reduce the number of germs on your skin by washing with CHG (chlorahexidine gluconate) soap before surgery.  CHG is an antiseptic cleaner which kills germs and bonds with the skin to continue killing germs even after washing. Please DO NOT use if you have an allergy to CHG or antibacterial soaps.  If your skin becomes reddened/irritated stop using the CHG and inform your nurse when you arrive at Short Stay. Do not shave  (including legs and underarms) for at least 48 hours prior to the first CHG shower.  You may shave your face/neck. Please follow these instructions carefully:  1.  Shower with CHG Soap the night before surgery and the  morning of Surgery.  2.  If you choose to wash your hair, wash your hair first as usual with your  normal  shampoo.  3.  After you shampoo, rinse your hair and body thoroughly to remove the  shampoo.                           4.  Use CHG as you would any other liquid soap.  You can apply chg directly  to the skin and wash                       Gently with a scrungie or clean washcloth.  5.  Apply the CHG Soap to your body ONLY FROM THE NECK DOWN.   Do not use on face/ open                           Wound or open sores. Avoid contact with eyes, ears mouth and  genitals (private parts).                       Wash face,  Genitals (private parts) with your normal soap.             6.  Wash thoroughly, paying special attention to the area where your surgery  will be performed.  7.  Thoroughly rinse your body with warm water from the neck down.  8.  DO NOT shower/wash with your normal soap after using and rinsing off  the CHG Soap.                9.  Pat yourself dry with a clean towel.            10.  Wear clean pajamas.            11.  Place clean sheets on your bed the night of your first shower and do not  sleep with pets. Day of Surgery : Do not apply any lotions/deodorants the morning of surgery.  Please wear clean clothes to the hospital/surgery center.  FAILURE TO FOLLOW THESE INSTRUCTIONS MAY RESULT IN THE CANCELLATION OF YOUR SURGERY PATIENT SIGNATURE_________________________________  NURSE SIGNATURE__________________________________  ________________________________________________________________________
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
## 2014-06-27 ENCOUNTER — Inpatient Hospital Stay (HOSPITAL_COMMUNITY): Payer: BLUE CROSS/BLUE SHIELD | Admitting: Anesthesiology

## 2014-06-27 ENCOUNTER — Inpatient Hospital Stay (HOSPITAL_COMMUNITY)
Admission: RE | Admit: 2014-06-27 | Discharge: 2014-06-29 | DRG: 621 | Disposition: A | Discharge status: Home / Self Care | Payer: BLUE CROSS/BLUE SHIELD | Source: Ambulatory Visit | Attending: Surgery | Admitting: Surgery

## 2014-06-27 ENCOUNTER — Encounter (HOSPITAL_COMMUNITY): Payer: Self-pay | Admitting: *Deleted

## 2014-06-27 ENCOUNTER — Encounter (HOSPITAL_COMMUNITY)
Admission: RE | Disposition: A | Discharge status: Home / Self Care | Payer: Self-pay | Source: Ambulatory Visit | Attending: Surgery

## 2014-06-27 DIAGNOSIS — Z7982 Long term (current) use of aspirin: Secondary | ICD-10-CM

## 2014-06-27 DIAGNOSIS — I10 Essential (primary) hypertension: Secondary | ICD-10-CM | POA: Diagnosis present

## 2014-06-27 DIAGNOSIS — K219 Gastro-esophageal reflux disease without esophagitis: Secondary | ICD-10-CM | POA: Diagnosis present

## 2014-06-27 DIAGNOSIS — E780 Pure hypercholesterolemia: Secondary | ICD-10-CM | POA: Diagnosis present

## 2014-06-27 DIAGNOSIS — Z880 Allergy status to penicillin: Secondary | ICD-10-CM

## 2014-06-27 DIAGNOSIS — Z90710 Acquired absence of both cervix and uterus: Secondary | ICD-10-CM | POA: Diagnosis not present

## 2014-06-27 DIAGNOSIS — K224 Dyskinesia of esophagus: Secondary | ICD-10-CM | POA: Diagnosis present

## 2014-06-27 DIAGNOSIS — Z9884 Bariatric surgery status: Secondary | ICD-10-CM

## 2014-06-27 DIAGNOSIS — Z6837 Body mass index (BMI) 37.0-37.9, adult: Secondary | ICD-10-CM

## 2014-06-27 DIAGNOSIS — Z87891 Personal history of nicotine dependence: Secondary | ICD-10-CM

## 2014-06-27 HISTORY — PX: LAPAROSCOPIC GASTRIC SLEEVE RESECTION: SHX5895

## 2014-06-27 LAB — PREGNANCY, URINE: PREG TEST UR: NEGATIVE

## 2014-06-27 LAB — HEMOGLOBIN AND HEMATOCRIT, BLOOD
HCT: 36.1 % (ref 36.0–46.0)
Hemoglobin: 11.9 g/dL — ABNORMAL LOW (ref 12.0–15.0)

## 2014-06-27 SURGERY — GASTRECTOMY, SLEEVE, LAPAROSCOPIC
Anesthesia: General | Site: Abdomen

## 2014-06-27 MED ORDER — LIDOCAINE HCL (CARDIAC) 20 MG/ML IV SOLN
INTRAVENOUS | Status: AC
  Filled 2014-06-27: qty 5

## 2014-06-27 MED ORDER — LACTATED RINGERS IR SOLN
Status: DC | PRN
  Administered 2014-06-27: 1000 mL

## 2014-06-27 MED ORDER — PROPOFOL 10 MG/ML IV BOLUS
INTRAVENOUS | Status: DC | PRN
  Administered 2014-06-27: 180 mg via INTRAVENOUS

## 2014-06-27 MED ORDER — NEOSTIGMINE METHYLSULFATE 10 MG/10ML IV SOLN
INTRAVENOUS | Status: DC | PRN
  Administered 2014-06-27: 4 mg via INTRAVENOUS

## 2014-06-27 MED ORDER — HEPARIN SODIUM (PORCINE) 5000 UNIT/ML IJ SOLN
5000.0000 [IU] | INTRAMUSCULAR | Status: AC
  Administered 2014-06-27: 5000 [IU] via SUBCUTANEOUS
  Filled 2014-06-27: qty 1

## 2014-06-27 MED ORDER — CEFOXITIN SODIUM 2 G IV SOLR: 2.0000 g | INTRAVENOUS | Status: DC

## 2014-06-27 MED ORDER — ACETAMINOPHEN 160 MG/5ML PO SOLN: 650.0000 mg | ORAL | Status: DC | PRN

## 2014-06-27 MED ORDER — TISSEEL VH 10 ML EX KIT
PACK | CUTANEOUS | Status: AC
  Filled 2014-06-27: qty 2

## 2014-06-27 MED ORDER — UNJURY VANILLA POWDER: 2.0000 [oz_av] | Freq: Four times a day (QID) | ORAL | Status: DC

## 2014-06-27 MED ORDER — DEXTROSE 5 % IV SOLN
INTRAVENOUS | Status: AC
  Filled 2014-06-27 (×2): qty 2

## 2014-06-27 MED ORDER — GLYCOPYRROLATE 0.2 MG/ML IJ SOLN
INTRAMUSCULAR | Status: AC
  Filled 2014-06-27: qty 3

## 2014-06-27 MED ORDER — PROMETHAZINE HCL 25 MG/ML IJ SOLN: 6.2500 mg | INTRAMUSCULAR | Status: DC | PRN

## 2014-06-27 MED ORDER — PROPOFOL 10 MG/ML IV BOLUS
INTRAVENOUS | Status: AC
  Filled 2014-06-27: qty 20

## 2014-06-27 MED ORDER — PHENYLEPHRINE HCL 10 MG/ML IJ SOLN
INTRAMUSCULAR | Status: DC | PRN
  Administered 2014-06-27: 80 ug via INTRAVENOUS

## 2014-06-27 MED ORDER — LACTATED RINGERS IV SOLN: INTRAVENOUS | Status: DC

## 2014-06-27 MED ORDER — LACTATED RINGERS IV SOLN
INTRAVENOUS | Status: DC | PRN
  Administered 2014-06-27: 07:00:00 via INTRAVENOUS

## 2014-06-27 MED ORDER — BUPIVACAINE HCL (PF) 0.5 % IJ SOLN
INTRAMUSCULAR | Status: DC | PRN
  Administered 2014-06-27: 30 mL

## 2014-06-27 MED ORDER — MORPHINE SULFATE 2 MG/ML IJ SOLN
2.0000 mg | INTRAMUSCULAR | Status: DC | PRN
  Administered 2014-06-27 – 2014-06-28 (×5): 4 mg via INTRAVENOUS
  Administered 2014-06-28: 2 mg via INTRAVENOUS
  Administered 2014-06-28: 4 mg via INTRAVENOUS
  Administered 2014-06-28 (×2): 2 mg via INTRAVENOUS
  Administered 2014-06-28: 4 mg via INTRAVENOUS
  Filled 2014-06-27: qty 1
  Filled 2014-06-27: qty 2
  Filled 2014-06-27: qty 1
  Filled 2014-06-27 (×5): qty 2
  Filled 2014-06-27: qty 1
  Filled 2014-06-27: qty 2

## 2014-06-27 MED ORDER — HEPARIN SODIUM (PORCINE) 5000 UNIT/ML IJ SOLN
5000.0000 [IU] | Freq: Three times a day (TID) | INTRAMUSCULAR | Status: DC
  Administered 2014-06-27 – 2014-06-29 (×6): 5000 [IU] via SUBCUTANEOUS
  Filled 2014-06-27 (×9): qty 1

## 2014-06-27 MED ORDER — FENTANYL CITRATE 0.05 MG/ML IJ SOLN
INTRAMUSCULAR | Status: DC | PRN
  Administered 2014-06-27: 100 ug via INTRAVENOUS
  Administered 2014-06-27 (×3): 50 ug via INTRAVENOUS

## 2014-06-27 MED ORDER — METOPROLOL TARTRATE 1 MG/ML IV SOLN
INTRAVENOUS | Status: DC | PRN
  Administered 2014-06-27: 2.5 mg via INTRAVENOUS

## 2014-06-27 MED ORDER — HYDROMORPHONE HCL 2 MG/ML IJ SOLN
INTRAMUSCULAR | Status: AC
  Filled 2014-06-27: qty 1

## 2014-06-27 MED ORDER — METOPROLOL TARTRATE 1 MG/ML IV SOLN
INTRAVENOUS | Status: AC
  Filled 2014-06-27: qty 5

## 2014-06-27 MED ORDER — MIDAZOLAM HCL 2 MG/2ML IJ SOLN
INTRAMUSCULAR | Status: AC
  Filled 2014-06-27: qty 2

## 2014-06-27 MED ORDER — OXYCODONE HCL 5 MG/5ML PO SOLN
5.0000 mg | ORAL | Status: DC | PRN
  Administered 2014-06-28 – 2014-06-29 (×3): 10 mg via ORAL
  Filled 2014-06-27 (×3): qty 10

## 2014-06-27 MED ORDER — ONDANSETRON HCL 4 MG/2ML IJ SOLN
4.0000 mg | INTRAMUSCULAR | Status: DC | PRN
  Administered 2014-06-27 – 2014-06-28 (×2): 4 mg via INTRAVENOUS
  Filled 2014-06-27 (×2): qty 2

## 2014-06-27 MED ORDER — UNJURY CHOCOLATE CLASSIC POWDER
2.0000 [oz_av] | Freq: Four times a day (QID) | ORAL | Status: DC
  Administered 2014-06-29: 2 [oz_av] via ORAL

## 2014-06-27 MED ORDER — 0.9 % SODIUM CHLORIDE (POUR BTL) OPTIME
TOPICAL | Status: DC | PRN
  Administered 2014-06-27: 1000 mL

## 2014-06-27 MED ORDER — CHLORHEXIDINE GLUCONATE 4 % EX LIQD: 60.0000 mL | Freq: Once | CUTANEOUS | Status: DC

## 2014-06-27 MED ORDER — ROCURONIUM BROMIDE 100 MG/10ML IV SOLN
INTRAVENOUS | Status: DC | PRN
  Administered 2014-06-27: 10 mg via INTRAVENOUS
  Administered 2014-06-27: 50 mg via INTRAVENOUS

## 2014-06-27 MED ORDER — HYDROMORPHONE HCL 1 MG/ML IJ SOLN
INTRAMUSCULAR | Status: DC | PRN
  Administered 2014-06-27: .5 mg via INTRAVENOUS
  Administered 2014-06-27 (×2): 0.5 mg via INTRAVENOUS
  Administered 2014-06-27: .5 mg via INTRAVENOUS

## 2014-06-27 MED ORDER — ONDANSETRON HCL 4 MG/2ML IJ SOLN
INTRAMUSCULAR | Status: AC
Start: 2014-06-27 — End: 2014-06-27
  Filled 2014-06-27: qty 2

## 2014-06-27 MED ORDER — DEXTROSE 5 % IV SOLN
INTRAVENOUS | Status: AC
  Filled 2014-06-27: qty 2

## 2014-06-27 MED ORDER — DIPHENHYDRAMINE HCL 50 MG/ML IJ SOLN
25.0000 mg | Freq: Four times a day (QID) | INTRAMUSCULAR | Status: DC | PRN
  Administered 2014-06-27 – 2014-06-29 (×5): 25 mg via INTRAVENOUS
  Filled 2014-06-27 (×5): qty 1

## 2014-06-27 MED ORDER — MEPERIDINE HCL 50 MG/ML IJ SOLN: 6.2500 mg | INTRAMUSCULAR | Status: DC | PRN

## 2014-06-27 MED ORDER — MIDAZOLAM HCL 5 MG/5ML IJ SOLN
INTRAMUSCULAR | Status: DC | PRN
  Administered 2014-06-27: 2 mg via INTRAVENOUS

## 2014-06-27 MED ORDER — DIPHENHYDRAMINE HCL 50 MG/ML IJ SOLN
INTRAMUSCULAR | Status: AC
  Filled 2014-06-27: qty 1

## 2014-06-27 MED ORDER — CEFOXITIN SODIUM 2 G IV SOLR
2.0000 g | INTRAVENOUS | Status: AC
  Administered 2014-06-27: 2 g via INTRAVENOUS

## 2014-06-27 MED ORDER — DIPHENHYDRAMINE HCL 50 MG/ML IJ SOLN
12.5000 mg | Freq: Once | INTRAMUSCULAR | Status: AC
  Administered 2014-06-27: 12.5 mg via INTRAVENOUS

## 2014-06-27 MED ORDER — ACETAMINOPHEN 160 MG/5ML PO SOLN: 325.0000 mg | ORAL | Status: DC | PRN

## 2014-06-27 MED ORDER — FENTANYL CITRATE 0.05 MG/ML IJ SOLN
25.0000 ug | INTRAMUSCULAR | Status: DC | PRN
Start: 2014-06-27 — End: 2014-06-27
  Administered 2014-06-27: 25 ug via INTRAVENOUS

## 2014-06-27 MED ORDER — BUPIVACAINE HCL (PF) 0.5 % IJ SOLN
INTRAMUSCULAR | Status: AC
  Filled 2014-06-27: qty 30

## 2014-06-27 MED ORDER — ROCURONIUM BROMIDE 100 MG/10ML IV SOLN
INTRAVENOUS | Status: AC
  Filled 2014-06-27: qty 1

## 2014-06-27 MED ORDER — ONDANSETRON HCL 4 MG/2ML IJ SOLN
INTRAMUSCULAR | Status: DC | PRN
  Administered 2014-06-27: 4 mg via INTRAVENOUS

## 2014-06-27 MED ORDER — UNJURY CHICKEN SOUP POWDER: 2.0000 [oz_av] | Freq: Four times a day (QID) | ORAL | Status: DC

## 2014-06-27 MED ORDER — FENTANYL CITRATE 0.05 MG/ML IJ SOLN
INTRAMUSCULAR | Status: AC
  Filled 2014-06-27: qty 2

## 2014-06-27 MED ORDER — FENTANYL CITRATE 0.05 MG/ML IJ SOLN
INTRAMUSCULAR | Status: AC
  Filled 2014-06-27: qty 5

## 2014-06-27 MED ORDER — LIDOCAINE HCL (CARDIAC) 20 MG/ML IV SOLN
INTRAVENOUS | Status: DC | PRN
  Administered 2014-06-27: 50 mg via INTRAVENOUS

## 2014-06-27 MED ORDER — POTASSIUM CHLORIDE IN NACL 20-0.45 MEQ/L-% IV SOLN
INTRAVENOUS | Status: DC
  Administered 2014-06-27 (×2): via INTRAVENOUS
  Administered 2014-06-28 (×2): 125 mL/h via INTRAVENOUS
  Administered 2014-06-28: 12:00:00 via INTRAVENOUS
  Administered 2014-06-29: 125 mL/h via INTRAVENOUS
  Filled 2014-06-27 (×8): qty 1000

## 2014-06-27 MED ORDER — GLYCOPYRROLATE 0.2 MG/ML IJ SOLN
INTRAMUSCULAR | Status: DC | PRN
  Administered 2014-06-27: .6 mg via INTRAVENOUS

## 2014-06-27 SURGICAL SUPPLY — 66 items
APL SRG 32X5 SNPLK LF DISP (MISCELLANEOUS) ×1
APPLICATOR COTTON TIP 6IN STRL (MISCELLANEOUS) IMPLANT
APPLIER CLIP ROT 10 11.4 M/L (STAPLE)
APPLIER CLIP ROT 13.4 12 LRG (CLIP)
APR CLP LRG 13.4X12 ROT 20 MLT (CLIP)
APR CLP MED LRG 11.4X10 (STAPLE)
BLADE SURG 15 STRL LF DISP TIS (BLADE) ×1 IMPLANT
BLADE SURG 15 STRL SS (BLADE) ×3
CABLE HIGH FREQUENCY MONO STRZ (ELECTRODE) IMPLANT
CHLORAPREP W/TINT 26ML (MISCELLANEOUS) ×3 IMPLANT
CLIP APPLIE ROT 10 11.4 M/L (STAPLE) IMPLANT
CLIP APPLIE ROT 13.4 12 LRG (CLIP) IMPLANT
DEVICE SUTURE ENDOST 10MM (ENDOMECHANICALS) IMPLANT
DEVICE TROCAR PUNCTURE CLOSURE (ENDOMECHANICALS) ×2 IMPLANT
DISSECTOR BLUNT TIP ENDO 5MM (MISCELLANEOUS) ×3 IMPLANT
DRAPE CAMERA CLOSED 9X96 (DRAPES) ×1 IMPLANT
DRAPE UTILITY XL STRL (DRAPES) ×6 IMPLANT
ELECT REM PT RETURN 9FT ADLT (ELECTROSURGICAL) ×3
ELECTRODE REM PT RTRN 9FT ADLT (ELECTROSURGICAL) ×1 IMPLANT
GAUZE SPONGE 4X4 12PLY STRL (GAUZE/BANDAGES/DRESSINGS) IMPLANT
GLOVE SURG SIGNA 7.5 PF LTX (GLOVE) ×3 IMPLANT
GOWN STRL REUS W/TWL XL LVL3 (GOWN DISPOSABLE) ×13 IMPLANT
HOVERMATT SINGLE USE (MISCELLANEOUS) ×3 IMPLANT
KIT BASIN OR (CUSTOM PROCEDURE TRAY) ×3 IMPLANT
LIQUID BAND (GAUZE/BANDAGES/DRESSINGS) ×3 IMPLANT
NDL SPNL 22GX3.5 QUINCKE BK (NEEDLE) ×1 IMPLANT
NEEDLE SPNL 22GX3.5 QUINCKE BK (NEEDLE) ×3 IMPLANT
PACK UNIVERSAL I (CUSTOM PROCEDURE TRAY) ×3 IMPLANT
PEN SKIN MARKING BROAD (MISCELLANEOUS) ×3 IMPLANT
RELOAD STAPLE 60 3.6 BLU REG (STAPLE) IMPLANT
RELOAD STAPLE 60 3.8 GOLD REG (STAPLE) IMPLANT
RELOAD STAPLE 60 4.1 GRN THCK (STAPLE) ×2 IMPLANT
RELOAD STAPLE 60 BLK VRY/THCK (STAPLE) IMPLANT
RELOAD STAPLER 60MM BLK (STAPLE) ×1 IMPLANT
RELOAD STAPLER BLUE 60MM (STAPLE) IMPLANT
RELOAD STAPLER GOLD 60MM (STAPLE) ×1 IMPLANT
RELOAD STAPLER GREEN 60MM (STAPLE) ×4 IMPLANT
SCISSORS LAP 5X35 DISP (ENDOMECHANICALS) IMPLANT
SEALANT SURGICAL APPL DUAL CAN (MISCELLANEOUS) ×3 IMPLANT
SET IRRIG TUBING LAPAROSCOPIC (IRRIGATION / IRRIGATOR) ×3 IMPLANT
SHEARS CURVED HARMONIC AC 45CM (MISCELLANEOUS) ×3 IMPLANT
SLEEVE ADV FIXATION 5X100MM (TROCAR) ×3 IMPLANT
SLEEVE GASTRECTOMY 36FR VISIGI (MISCELLANEOUS) ×3 IMPLANT
SOLUTION ANTI FOG 6CC (MISCELLANEOUS) ×3 IMPLANT
SPONGE LAP 18X18 X RAY DECT (DISPOSABLE) ×3 IMPLANT
STAPLER ECHELON LONG 60 440 (INSTRUMENTS) IMPLANT
STAPLER RELOAD 60MM BLK (STAPLE) ×3
STAPLER RELOAD BLUE 60MM (STAPLE)
STAPLER RELOAD GOLD 60MM (STAPLE) ×3
STAPLER RELOAD GREEN 60MM (STAPLE) ×12
SUT ETHILON 2 0 PS N (SUTURE) IMPLANT
SUT MNCRL AB 4-0 PS2 18 (SUTURE) ×4 IMPLANT
SUT MON AB 5-0 PS2 18 (SUTURE) ×3 IMPLANT
SYR 20CC LL (SYRINGE) ×3 IMPLANT
TOWEL OR 17X26 10 PK STRL BLUE (TOWEL DISPOSABLE) ×3 IMPLANT
TOWEL OR NON WOVEN STRL DISP B (DISPOSABLE) ×3 IMPLANT
TRAY FOLEY CATH 14FRSI W/METER (CATHETERS) ×3 IMPLANT
TROCAR ADV FIXATION 12X100MM (TROCAR) ×3 IMPLANT
TROCAR ADV FIXATION 5X100MM (TROCAR) ×3 IMPLANT
TROCAR BLADELESS 15MM (ENDOMECHANICALS) ×3 IMPLANT
TROCAR BLADELESS OPT 5 100 (ENDOMECHANICALS) ×5 IMPLANT
TROCAR XCEL 12X100 BLDLESS (ENDOMECHANICALS) IMPLANT
TUBING CONNECTING 10 (TUBING) ×2 IMPLANT
TUBING CONNECTING 10' (TUBING) ×1
TUBING ENDO SMARTCAP PENTAX (MISCELLANEOUS) ×3 IMPLANT
TUBING FILTER THERMOFLATOR (ELECTROSURGICAL) ×1 IMPLANT

## 2014-06-27 NOTE — Op Note (Signed)
PATIENT:   Anna Castillo DOB:   November 18, 1955 MRN:   [id number]  DATE OF PROCEDURE: 06/27/2014                   FACILITY:  [facility]  OPERATIVE REPORT  PREOPERATIVE DIAGNOSIS:  Morbid obesity.  POSTOPERATIVE DIAGNOSIS:  Morbid obesity (weight 221, BMI of 37.9).  PROCEDURE:  Laparoscopic Sleeve gastrectomy (intraoperative upper endoscopy by Dr. Redmond Pulling)  SURGEON:  Fenton Malling. Lucia Gaskins, MD  FIRST ASSISTANTOk Anis, MD  ANESTHESIA:  General endotracheal.  Anesthesiologist: Rod Mae, MD; Montez Hageman, MD CRNA: Anne Fu, CRNA  General  ESTIMATED BLOOD LOSS:  Minimal.  LOCAL ANESTHESIA:  30 cc of 7/0% Marcaine  COMPLICATIONS:  None.  INDICATION FOR SURGERY:  Anna Castillo is a 59 y.o. white  female who sees Tawanna Solo, MD as her primary care doctor.  She has completed our preoperative bariatric program and now comes for a laparoscopic Sleeve gastrectomy.  The indications, potential complications of surgery were explained to the patient.  Potential complications of the surgery include, but are not limited to, bleeding, infection, DVT, open surgery, and long-term nutritional consequences.  OPERATIVE NOTE:  The patient taken to room #1 at [facility] where Ms. Dout underwent a general endotracheal anesthetic, supervised by Anesthesiologist: Rod Mae, MD; Montez Hageman, MD CRNA: Anne Fu, CRNA.  The patient was given 2 g of cefoxitin at the beginning of the procedure.  A time-out was held and surgical checklist run.  I accessed her abdominal cavity through the left upper quadrant with a 12 mm Optiview. I did an abdominal exploration.   Her omentum and bowel were unremarkable. The right and left lobes of the liver unremarkable. There were adhesions in her gall bladder bed.  Her stomach was unremarkable.   I placed a total of 6 trocars. I placed a 5 mm left lateral trocar, a 5 mm left paramedian trocar (for the scope), a 12 mm right paramedian torcar, a 5 mm right  subcostal trocar that I converted to a 15 mm to extract the stomach and 5 mm subxiphoid trocar for the liver retractor.  Because of her history of reflux, I used the lap band balloon to test her hiatus.  But with 10cc of air in the balloon, the balloon test held up at the hiatus.  I started out taking down the greater curvature attachments of the stomach. I measured approximately 6 cm proximal from the pylorus and mobilized the greater curvature of the stomach with the Harmonic Scalpel. I took this dissection cranially around the greater curvature of her stomach to the angle of His and the left crus.   After I had mobilized the greater curvature of the stomach, I then passed the 36 French ViSiGi bougie which was used to suck the stomach up against the lesser curvature and placed into the antrum. She had a thicker than normal stomach.  During the staple firing,  I tried to give the Higginson a cuff at least about 1 cm. I tried to avoid narrowing the incisura. I used a total of 7 staple firings.  From antrum to the angle of His I used 1 black,  2 green, 3 gold and 1 blue Eschelon 60 mm Ethicon staplers.  At each firing of the EndoGIA stapler, I inspected the stomach, anterior wall of the stomach, and underneath to make sure there was no compromise or impingement on to the ViSiGi bougie.   The staple line seemed linear without  any corkscrewing of the stomach. Hemostasis was good.   I did not use any reinforcement. She did have at least 2 areas of bleeding which I used clips to clip on the new greater curvature of the stomach.  Because I thought we had a good staple line, I then had the ViSiGi was converted to insufflate the pouch. A new stomach pouch was placed under water. There was no bubbling or leak noted.   At this point, Dr. Redmond Pulling broke scrub and passed an upper endoscope down through the esophagus into the stomach pouch. The EG junction was at about 41 cm. The stomach was tubular. There was no  narrowing of the stomach pouch or angulation. We were easily able to pass the endoscope into the antrum and again put air pressure on the staple line. I irrigated the upper abdomen with saline. There was no bubbling or evidence of air leak. The mucosa looked viable. Dr. Redmond Pulling decompressed the stomach with the endoscopy.   I converted to right subcostal trocar to a 15 trocar and extracted the stomach remnant through this intact and sent this to Pathology. I then placed 10 cc of Tisseel along the new greater curvature staple line and covered the entire staple line with the Tisseel.  I aspirated out the saline that I had irrigated because I thought the staple line looked viable and complete. There was no evidence of leak. I did not leave a drain in place.   Then, I closed the trocar sites. I placed 2-0 Vicryl sutures at the 15-mm port site in the right upper quadrant. The other port sites seemed smaller not requiring sutures. I closed the skin at each site with a 4-0 Monocryl, painted each wound with LiquiBand.   The patient was transported to recovery room in good condition. Sponge and needle count were correct at the end of the case.    Alphonsa Overall, MD, [facility] Surgery Pager: [phone] Office phone:  [phone]

## 2014-06-27 NOTE — Op Note (Signed)
Anna Castillo [id number] 08/04/1955 06/27/2014  Preoperative diagnosis: morbid obesity  Postoperative diagnosis: Same   Procedure: upper endoscopy  Surgeon: Leighton Ruff. Ashe Gago M.D., FACS FASMBS  Anesthesia: Gen.   Indications for procedure: 59 year old female undergoing Laparoscopic Gastric Sleeve Resection and an EGD was requested to evaluate the new gastric sleeve.   Description of procedure: After we have completed the sleeve resection, I scrubbed out and obtained the Olympus endoscope. I gently placed endoscope in the patient's oropharynx and gently glided it down the esophagus without any difficulty under direct visualization. Once I was in the gastric sleeve, I insufflated the stomach with air. I was able to cannulate and advanced the scope through the gastric sleeve. I was able to cannulate the duodenum with ease. Dr. Lucia Gaskins had placed saline in the upper abdomen. Upon further insufflation of the gastric sleeve there was no evidence of bubbles. GE junction located at 41 cm.  Upon further inspection of the gastric sleeve, the mucosa appeared normal. There is no evidence of any mucosal abnormality. The sleeve was widely patent at the angularis. There was no evidence of bleeding. The gastric sleeve was decompressed. The scope was withdrawn. The patient tolerated this portion of the procedure well. Please see Dr Pollie Friar operative note for details regarding the laparoscopic gastric sleeve resection.   Leighton Ruff. Redmond Pulling, MD, FACS  General, Bariatric, & Minimally Invasive Surgery  [facility], [place]

## 2014-06-27 NOTE — Transfer of Care (Signed)
Immediate Anesthesia Transfer of Care Note  Patient: Anna Castillo  Procedure(s) Performed: Procedure(s) (LRB): LAPAROSCOPIC SLEEVE GASTRETOMY WITH UPPER ENDOSCOPY (N/A)  Patient Location: PACU  Anesthesia Type: General  Level of Consciousness: sedated, patient cooperative and responds to stimulation  Airway & Oxygen Therapy: Patient Spontanous Breathing and Patient connected to face mask oxgen  Post-op Assessment: Report given to PACU RN and Post -op Vital signs reviewed and stable  Post vital signs: Reviewed and stable  Complications: No apparent anesthesia complications

## 2014-06-27 NOTE — Progress Notes (Signed)
Telephone order from MD Lucia Gaskins for pt to received Benadryl IV 25 mg as needed for itching, order entered Neta Mends RN 11:53 AM 06-27-2014

## 2014-06-27 NOTE — Interval H&P Note (Signed)
History and Physical Interval Note:  06/27/2014 7:06 AM  Anna Castillo  has presented today for surgery, with the diagnosis of morbid obesity  The various methods of treatment have been discussed with the patient and family.  Boyfriend, Cannon Kettle, is with patient.   After consideration of risks, benefits and other options for treatment, the patient has consented to  Procedure(s): [place] (N/A) as a surgical intervention .  The patient's history has been reviewed, patient examined, no change in status, stable for surgery.  I have reviewed the patient's chart and labs.  Questions were answered to the patient's satisfaction.     [name] H

## 2014-06-27 NOTE — Anesthesia Postprocedure Evaluation (Signed)
  Anesthesia Post-op Note  Patient: Anna Castillo  Procedure(s) Performed: Procedure(s) (LRB): LAPAROSCOPIC SLEEVE GASTRETOMY WITH UPPER ENDOSCOPY (N/A)  Patient Location: PACU  Anesthesia Type: General  Level of Consciousness: awake and alert   Airway and Oxygen Therapy: Patient Spontanous Breathing  Post-op Pain: mild  Post-op Assessment: Post-op Vital signs reviewed, Patient's Cardiovascular Status Stable, Respiratory Function Stable, Patent Airway and No signs of Nausea or vomiting  Last Vitals:  Filed Vitals:   06/27/14 1028  BP: 157/88  Pulse: 80  Temp: 36.4 C  Resp: 14    Post-op Vital Signs: stable   Complications: No apparent anesthesia complications

## 2014-06-27 NOTE — Anesthesia Preprocedure Evaluation (Addendum)
Anesthesia Evaluation  Patient identified by MRN, date of birth, ID band Patient awake    Reviewed: Allergy & Precautions, NPO status , Patient's Chart, lab work & pertinent test results  History of Anesthesia Complications (+) PONV  Airway Mallampati: I  TM Distance: >3 FB Neck ROM: Full    Dental no notable dental hx.    Pulmonary neg pulmonary ROS, former smoker,  breath sounds clear to auscultation  Pulmonary exam normal       Cardiovascular hypertension, Pt. on medications Rhythm:Regular Rate:Normal     Neuro/Psych negative neurological ROS  negative psych ROS   GI/Hepatic negative GI ROS, Neg liver ROS,   Endo/Other  Morbid obesity  Renal/GU negative Renal ROS  negative genitourinary   Musculoskeletal negative musculoskeletal ROS (+)   Abdominal   Peds negative pediatric ROS (+)  Hematology negative hematology ROS (+)   Anesthesia Other Findings   Reproductive/Obstetrics negative OB ROS                            Anesthesia Physical Anesthesia Plan  ASA: III  Anesthesia Plan: General   Post-op Pain Management:    Induction: Intravenous  Airway Management Planned: Oral ETT  Additional Equipment:   Intra-op Plan:   Post-operative Plan: Extubation in OR  Informed Consent: I have reviewed the patients History and Physical, chart, labs and discussed the procedure including the risks, benefits and alternatives for the proposed anesthesia with the patient or authorized representative who has indicated his/her understanding and acceptance.   Dental advisory given  Plan Discussed with: CRNA  Anesthesia Plan Comments:         Anesthesia Quick Evaluation

## 2014-06-27 NOTE — Anesthesia Procedure Notes (Signed)
Procedure Name: Intubation Date/Time: 06/27/2014 7:21 AM Performed by: Anne Fu Pre-anesthesia Checklist: Patient identified, Emergency Drugs available, Suction available, Patient being monitored and Timeout performed Patient Re-evaluated:Patient Re-evaluated prior to inductionOxygen Delivery Method: Circle system utilized Preoxygenation: Pre-oxygenation with 100% oxygen Intubation Type: IV induction Ventilation: Mask ventilation without difficulty Laryngoscope Size: Mac and 4 Grade View: Grade I Tube type: Oral Tube size: 7.5 mm Number of attempts: 1 Airway Equipment and Method: Stylet Placement Confirmation: ETT inserted through vocal cords under direct vision,  positive ETCO2,  CO2 detector and breath sounds checked- equal and bilateral Secured at: 19 cm Tube secured with: Tape Dental Injury: Teeth and Oropharynx as per pre-operative assessment

## 2014-06-28 ENCOUNTER — Inpatient Hospital Stay (HOSPITAL_COMMUNITY): Payer: BLUE CROSS/BLUE SHIELD

## 2014-06-28 ENCOUNTER — Encounter (HOSPITAL_COMMUNITY): Payer: Self-pay | Admitting: Surgery

## 2014-06-28 LAB — CBC WITH DIFFERENTIAL/PLATELET
BASOS PCT: 0 % (ref 0–1)
Basophils Absolute: 0 10*3/uL (ref 0.0–0.1)
Eosinophils Absolute: 0 10*3/uL (ref 0.0–0.7)
Eosinophils Relative: 1 % (ref 0–5)
HCT: 35 % — ABNORMAL LOW (ref 36.0–46.0)
HEMOGLOBIN: 11.5 g/dL — AB (ref 12.0–15.0)
LYMPHS ABS: 1.9 10*3/uL (ref 0.7–4.0)
LYMPHS PCT: 24 % (ref 12–46)
MCH: 32.7 pg (ref 26.0–34.0)
MCHC: 32.9 g/dL (ref 30.0–36.0)
MCV: 99.4 fL (ref 78.0–100.0)
MONO ABS: 0.8 10*3/uL (ref 0.1–1.0)
Monocytes Relative: 9 % (ref 3–12)
NEUTROS PCT: 66 % (ref 43–77)
Neutro Abs: 5.3 10*3/uL (ref 1.7–7.7)
Platelets: 186 10*3/uL (ref 150–400)
RBC: 3.52 MIL/uL — ABNORMAL LOW (ref 3.87–5.11)
RDW: 12.5 % (ref 11.5–15.5)
WBC: 8 10*3/uL (ref 4.0–10.5)

## 2014-06-28 LAB — HEMOGLOBIN AND HEMATOCRIT, BLOOD
HCT: 34.7 % — ABNORMAL LOW (ref 36.0–46.0)
Hemoglobin: 11.2 g/dL — ABNORMAL LOW (ref 12.0–15.0)

## 2014-06-28 MED ORDER — IOHEXOL 300 MG/ML  SOLN
50.0000 mL | Freq: Once | INTRAMUSCULAR | Status: AC | PRN
  Administered 2014-06-28: 20 mL via ORAL

## 2014-06-28 NOTE — Progress Notes (Addendum)
Patient alert and oriented, Post op day 1.  Provided support and encouragement.  Encouraged pulmonary toilet, ambulation and small sips of liquids.  All questions answered.  Will continue to monitor. 

## 2014-06-28 NOTE — Progress Notes (Signed)
General Surgery Note  LOS: 1 day  POD -  1 Day Post-Op  Assessment/Plan: 1  LAPAROSCOPIC SLEEVE GASTRETOMY WITH UPPER ENDOSCOPY - 06/27/2014 - D. Kameran Lallier  Weight 221, BMI of 37.9   For swallow today  2.  HTN 3.  History of back surgery in 2012 - [facility] 4.  Right knee issues 5.  Quit smoking 2015 6.  DVT prophylaxis - SQ Heparin   Active Problems:   Morbid obesity   Subjective:  Some nausea and sore last night.  She did get up and walk around.  Objective:   Filed Vitals:   06/28/14 0540  BP: 139/75  Pulse: 93  Temp: 99.3 F (37.4 C)  Resp: 16     Intake/Output from previous day:  03/08 0701 - 03/09 0700 In: 3731.3 [I.V.:3731.3] Out: 1600 [Urine:1600]  Intake/Output this shift:      Physical Exam:   General: Obese wF who is alert and oriented.    HEENT: Normal. Pupils equal. .   Lungs: Clear.  IS - 1,200cc.   Abdomen: Soft.  Rare BS.   Wound: Clean   Lab Results:    Recent Labs  06/27/14 0940 06/28/14 0531  WBC  --  8.0  HGB 11.9* 11.5*  HCT 36.1 35.0*  PLT  --  186    BMET  No results for input(s): NA, K, CL, CO2, GLUCOSE, BUN, CREATININE, CALCIUM in the last 72 hours.  PT/INR  No results for input(s): LABPROT, INR in the last 72 hours.  ABG  No results for input(s): PHART, HCO3 in the last 72 hours.  Invalid input(s): PCO2, PO2   Studies/Results:  No results found.   Anti-infectives:   Anti-infectives    Start     Dose/Rate Route Frequency Ordered Stop   06/27/14 0502  cefOXitin (MEFOXIN) 2 g in dextrose 5 % 50 mL IVPB  Status:  Discontinued     2 g 100 mL/hr over 30 Minutes Intravenous On call to O.R. 06/27/14 0502 06/27/14 0503   06/27/14 0502  cefOXitin (MEFOXIN) 2 g in dextrose 5 % 50 mL IVPB     2 g 100 mL/hr over 30 Minutes Intravenous On call to O.R. 06/27/14 0502 06/27/14 0730      Alphonsa Overall, MD, FACS Pager: [place] [facility] Office: [phone] 06/28/2014

## 2014-06-28 NOTE — Care Management Note (Signed)
    Page 1 of 1   06/28/2014     10:48:22 AM CARE MANAGEMENT NOTE 06/28/2014  Patient:  Anna Castillo, Anna Castillo   Account Number:  [id number]  Date Initiated:  06/28/2014  Documentation initiated by:  Sunday Spillers  Subjective/Objective Assessment:   59 yo female admitted s/p gastric sleeve. PTA lived at home.     Action/Plan:   Home when stable   Anticipated DC Date:  06/28/2014   Anticipated DC Plan:  [place]  CM consult      Choice offered to / List presented to:             Status of service:  Completed, signed off Medicare Important Message given?   (If response is "NO", the following Medicare IM given date fields will be blank) Date Medicare IM given:   Medicare IM given by:   Date Additional Medicare IM given:   Additional Medicare IM given by:    Discharge Disposition:  HOME/SELF CARE  Per UR Regulation:  Reviewed for med. necessity/level of care/duration of stay  If discussed at [place] of Stay Meetings, dates discussed:    Comments:

## 2014-06-29 LAB — CBC WITH DIFFERENTIAL/PLATELET
BASOS ABS: 0 10*3/uL (ref 0.0–0.1)
Basophils Relative: 0 % (ref 0–1)
EOS ABS: 0.1 10*3/uL (ref 0.0–0.7)
Eosinophils Relative: 2 % (ref 0–5)
HCT: 33.2 % — ABNORMAL LOW (ref 36.0–46.0)
Hemoglobin: 11 g/dL — ABNORMAL LOW (ref 12.0–15.0)
LYMPHS ABS: 1.6 10*3/uL (ref 0.7–4.0)
Lymphocytes Relative: 25 % (ref 12–46)
MCH: 32.8 pg (ref 26.0–34.0)
MCHC: 33.1 g/dL (ref 30.0–36.0)
MCV: 99.1 fL (ref 78.0–100.0)
MONO ABS: 0.8 10*3/uL (ref 0.1–1.0)
Monocytes Relative: 13 % — ABNORMAL HIGH (ref 3–12)
NEUTROS ABS: 3.9 10*3/uL (ref 1.7–7.7)
Neutrophils Relative %: 60 % (ref 43–77)
PLATELETS: 134 10*3/uL — AB (ref 150–400)
RBC: 3.35 MIL/uL — ABNORMAL LOW (ref 3.87–5.11)
RDW: 12.5 % (ref 11.5–15.5)
WBC: 6.4 10*3/uL (ref 4.0–10.5)

## 2014-06-29 NOTE — Progress Notes (Signed)
Patient alert and oriented, pain is controlled. Patient is tolerating fluids,  advanced to protein shake today, patient tolerated well.  Reviewed Gastric sleeve discharge instructions with patient and patient is able to articulate understanding.  Provided information on BELT program, Support Group and [facility] outpatient pharmacy. All questions answered, will continue to monitor.  

## 2014-06-29 NOTE — Discharge Summary (Signed)
Physician Discharge Summary  Patient ID:  Anna Castillo  MRN: [id number]  DOB/AGE: Jan 09, 1956 59 y.o.  Admit date: 06/27/2014 Discharge date: 06/29/2014  Discharge Diagnoses:  1 Morbid Obesity -Weight 221, BMI of 37.9  LAPAROSCOPIC SLEEVE GASTRETOMY WITH UPPER ENDOSCOPY - 06/27/2014 - D. Jakoby Melendrez 2. HTN 3. History of back surgery in 2012 - [facility] 4. Right knee issues 5. Quit smoking 2015   Active Problems:   Morbid obesity  Operation: Procedure(s): LAPAROSCOPIC SLEEVE GASTRETOMY WITH UPPER ENDOSCOPY on 06/27/2014 - D. Lucia Gaskins  Discharged Condition: good  Hospital Course: Anna Castillo is an 59 y.o. female whose primary care physician is Tawanna Solo, MD and who was admitted 06/27/2014 with a chief complaint of morbid obesity.   She was brought to the operating room on 06/27/2014 and underwent  [facility] ENDOSCOPY.  She had a swallow the first post op day that showed normal post op anatomy. She has tolerated water well.  She will take the protein supplements before leaving. She is ready to go home.  The discharge instructions were reviewed with the patient.  Consults: None  Significant Diagnostic Studies: Results for orders placed or performed during the hospital encounter of 06/27/14  Pregnancy, urine STAT morning of surgery  Result Value Ref Range   Preg Test, Ur NEGATIVE NEGATIVE  Hemoglobin and hematocrit, blood  Result Value Ref Range   Hemoglobin 11.9 (L) 12.0 - 15.0 g/dL   HCT 36.1 36.0 - 46.0 %  CBC WITH DIFFERENTIAL  Result Value Ref Range   WBC 8.0 4.0 - 10.5 K/uL   RBC 3.52 (L) 3.87 - 5.11 MIL/uL   Hemoglobin 11.5 (L) 12.0 - 15.0 g/dL   HCT 35.0 (L) 36.0 - 46.0 %   MCV 99.4 78.0 - 100.0 fL   MCH 32.7 26.0 - 34.0 pg   MCHC 32.9 30.0 - 36.0 g/dL   RDW 12.5 11.5 - 15.5 %   Platelets 186 150 - 400 K/uL   Neutrophils Relative % 66 43 - 77 %   Neutro Abs 5.3 1.7 - 7.7 K/uL   Lymphocytes Relative 24 12 - 46 %   Lymphs  Abs 1.9 0.7 - 4.0 K/uL   Monocytes Relative 9 3 - 12 %   Monocytes Absolute 0.8 0.1 - 1.0 K/uL   Eosinophils Relative 1 0 - 5 %   Eosinophils Absolute 0.0 0.0 - 0.7 K/uL   Basophils Relative 0 0 - 1 %   Basophils Absolute 0.0 0.0 - 0.1 K/uL  Hemoglobin and hematocrit, blood  Result Value Ref Range   Hemoglobin 11.2 (L) 12.0 - 15.0 g/dL   HCT 34.7 (L) 36.0 - 46.0 %  CBC with Differential  Result Value Ref Range   WBC 6.4 4.0 - 10.5 K/uL   RBC 3.35 (L) 3.87 - 5.11 MIL/uL   Hemoglobin 11.0 (L) 12.0 - 15.0 g/dL   HCT 33.2 (L) 36.0 - 46.0 %   MCV 99.1 78.0 - 100.0 fL   MCH 32.8 26.0 - 34.0 pg   MCHC 33.1 30.0 - 36.0 g/dL   RDW 12.5 11.5 - 15.5 %   Platelets 134 (L) 150 - 400 K/uL   Neutrophils Relative % 60 43 - 77 %   Lymphocytes Relative 25 12 - 46 %   Monocytes Relative 13 (H) 3 - 12 %   Eosinophils Relative 2 0 - 5 %   Basophils Relative 0 0 - 1 %   Neutro Abs 3.9 1.7 - 7.7 K/uL  Lymphs Abs 1.6 0.7 - 4.0 K/uL   Monocytes Absolute 0.8 0.1 - 1.0 K/uL   Eosinophils Absolute 0.1 0.0 - 0.7 K/uL   Basophils Absolute 0.0 0.0 - 0.1 K/uL    Dg Ugi W/water Sol Cm  06/28/2014   CLINICAL DATA:  59 year old female status post laparoscopic sleeve gastrectomy.  EXAM: WATER SOLUBLE UPPER GI SERIES  TECHNIQUE: Single-column upper GI series was performed using water soluble contrast.  CONTRAST:  85mL OMNIPAQUE IOHEXOL 300 MG/ML  SOLN  COMPARISON:  05/02/2014.  FLUOROSCOPY TIME:  If the device does not provide the exposure index:  Fluoroscopy Time (in minutes and seconds):  1 minutes 44 seconds  Number of Acquired Images:  30  FINDINGS: Preprocedural KUB demonstrated a suture line projecting over the expected location of the stomach. Multiple surgical clips project over the right upper quadrant of the abdomen. Orthopedic fixation hardware in the lumbar spine. Gas noted throughout the small bowel and colon.  Oral contrast was administered, which passed readily into the stomach. A narrowed gastric lumen  characteristic of sleeve gastrectomy was observed. No extravasation of contrast material is noted. Contrast passed readily into the proximal small bowel.  IMPRESSION: 1. Expected postoperative changes of sleeve gastrectomy, as above, without evidence of extravasation or other acute complicating features.   Electronically Signed   By: Vinnie Langton M.D.   On: 06/28/2014 10:14    Discharge Exam:  Filed Vitals:   06/29/14 0538  BP: 131/72  Pulse: 101  Temp: 98.4 F (36.9 C)  Resp: 15    General: Obese WF who is alert and generally healthy appearing.  Lungs: Clear to auscultation and symmetric breath sounds. Heart:  RRR. No murmur or rub. Abdomen: Soft. No mass. No hernia. Normal bowel sounds. Incisions look good.   Discharge Medications:     Medication List    ASK your doctor about these medications        ALPRAZolam 0.5 MG tablet  Commonly known as:  XANAX  Take 0.5 mg by mouth 2 (two) times daily as needed for anxiety.     calcium citrate 950 MG tablet  Commonly known as:  CALCITRATE - dosed in mg elemental calcium  Take 200 mg of elemental calcium by mouth daily.     cyclobenzaprine 10 MG tablet  Commonly known as:  FLEXERIL  Take 10 mg by mouth as needed.     estradiol 0.5 MG tablet  Commonly known as:  ESTRACE  Take 0.5 mg by mouth daily.     hydrochlorothiazide 25 MG tablet  Commonly known as:  HYDRODIURIL  Take 25 mg by mouth daily.     losartan 25 MG tablet  Commonly known as:  COZAAR  Take 25 mg by mouth daily.     meloxicam 7.5 MG tablet  Commonly known as:  MOBIC  Take 7.5 mg by mouth daily.     multivitamin with minerals Tabs tablet  Take 1 tablet by mouth daily.     omeprazole 20 MG capsule  Commonly known as:  PRILOSEC  Take 20 mg by mouth daily.     VITAMIN B-12 SL  Place 1 tablet under the tongue daily.        Disposition: 01-Home or Self Care  Activity:  Driving - May drive in 2 or 3 days, if doing well and not taking pain  meds   Lifting - No lifting more than 15 pounds for 10 days, then no lmit  Wound Care:   May shower  tonight  Diet:  Protein drinks.  Follow up appointment:  Call Dr. Pollie Friar office [facility]) at [phone] for an appointment in 2 weeks  Medications and dosages:  Resume your home medications.  You have a prescription for:  Oxycodone elixir    Signed: Alphonsa Overall, M.D., [facility] Office:  [phone]  06/29/2014, 8:37 AM

## 2014-06-29 NOTE — Plan of Care (Signed)
Problem: Food- and Nutrition-Related Knowledge Deficit (NB-1.1) Goal: Nutrition education Formal process to instruct or train a patient/client in a skill or to impart knowledge to help patients/clients voluntarily manage or modify food choices and eating behavior to maintain or improve health. Outcome: Completed/Met Date Met:  06/29/14 Nutrition Education Note  Received consult for diet education per DROP protocol.   Discussed 2 week post op diet with pt. Emphasized that liquids must be non carbonated, non caffeinated, and sugar free. Fluid goals discussed. Pt to follow up with outpatient bariatric RD for further diet progression after 2 weeks. Multivitamins and minerals also reviewed. Teach back method used, pt expressed understanding, expect good compliance.   Diet: First 2 Weeks  You will see the nutritionist about two (2) weeks after your surgery. The nutritionist will increase the types of foods you can eat if you are handling liquids well:  If you have severe vomiting or nausea and cannot handle clear liquids lasting longer than 1 day, call your surgeon  Protein Shake  Drink at least 2 ounces of shake 5-6 times per day  Each serving of protein shakes (usually 8 - 12 ounces) should have a minimum of:  15 grams of protein  And no more than 5 grams of carbohydrate  Goal for protein each day:  Men = 80 grams per day  Women = 60 grams per day  Protein powder may be added to fluids such as non-fat milk or Lactaid milk or Soy milk (limit to 35 grams added protein powder per serving)   Hydration  Slowly increase the amount of water and other clear liquids as tolerated (See Acceptable Fluids)  Slowly increase the amount of protein shake as tolerated  Sip fluids slowly and throughout the day  May use sugar substitutes in small amounts (no more than 6 - 8 packets per day; i.e. Splenda)   Fluid Goal  The first goal is to drink at least 8 ounces of protein shake/drink per day (or as directed  by the nutritionist); some examples of protein shakes are Johnson [employer] Johnson, AMR Corporation, EAS Edge HP, and Unjury. See handout from pre-op Bariatric Education Class:  Slowly increase the amount of protein shake you drink as tolerated  You may find it easier to slowly sip shakes throughout the day  It is important to get your proteins in first  Your fluid goal is to drink 64 - 100 ounces of fluid daily  It may take a few weeks to build up to this  32 oz (or more) should be clear liquids  And  32 oz (or more) should be full liquids (see below for examples)  Liquids should not contain sugar, caffeine, or carbonation   Clear Liquids:  Water or Sugar-free flavored water (i.e. Fruit H2O, Propel)  Decaffeinated coffee or tea (sugar-free)  Crystal Lite, Wyler's Lite, Minute Maid Lite  Sugar-free Jell-O  Bouillon or broth  Sugar-free Popsicle: *Less than 20 calories each; Limit 1 per day   Full Liquids:  Protein Shakes/Drinks + 2 choices per day of other full liquids  Full liquids must be:  No More Than 12 grams of Carbs per serving  No More Than 3 grams of Fat per serving  Strained low-fat cream soup  Non-Fat milk  Fat-free Lactaid Milk  Sugar-free yogurt (Dannon Lite & Fit, Greek yogurt)

## 2014-06-29 NOTE — Discharge Instructions (Signed)
[facility] - DISCHARGE INSTRUCTIONS TO PATIENT  Activity:  Driving - May drive in 2 or 3 days, if doing well and not taking pain meds   Lifting - No lifting more than 15 pounds for 10 days, then no lmit  Wound Care:   May shower tonight  Diet:  Protein drinks.  Follow up appointment:  Call Dr. Pollie Friar office [facility]) at [phone] for an appointment in 2 weeks  Medications and dosages:  Resume your home medications.  You have a prescription for:  Oxycodone elixir  Call Dr. Lucia Gaskins or his office  [phone]) if you have:  Temperature greater than 100.4,  Persistent nausea and vomiting,  Severe uncontrolled pain,  Redness, tenderness, or signs of infection (pain, swelling, redness, odor or green/yellow discharge around the site),  Difficulty breathing, headache or visual disturbances,  Any other questions or concerns you may have after discharge.  In an emergency, call 911 or go to an Emergency Department at a nearby hospital.       GASTRIC BYPASS/SLEEVE  Home Care Instructions   These instructions are to help you care for yourself when you go home.  Call: If you have any problems. Call [phone] and ask for the surgeon on call If you need immediate assistance come to the ER at [facility]. Tell the ER staff you are a new post-op gastric bypass or gastric sleeve patient  Signs and symptoms to report: Severe  vomiting or nausea If you cannot handle clear liquids for longer than 1 day, call your surgeon Abdominal pain which does not get better after taking your pain medication Fever greater than 100.4  F and chills Heart rate over 100 beats a minute Trouble breathing Chest pain Redness,  swelling, drainage, or foul odor at incision (surgical) sites If your incisions open or pull apart Swelling or pain in calf (lower leg) Diarrhea (Loose bowel movements that happen often), frequent watery, uncontrolled bowel movements Constipation,  (no bowel movements for 3 days) if this happens: Take Milk of Magnesia, 2 tablespoons by mouth, 3 times a day for 2 days if needed Stop taking Milk of Magnesia once you have had a bowel movement Call your doctor if constipation continues Or Take Miralax  (instead of Milk of Magnesia) following the label instructions Stop taking Miralax once you have had a bowel movement Call your doctor if constipation continues Anything you think is abnormal for you   Normal side effects after surgery: Unable to sleep at night or unable to concentrate Irritability Being tearful (crying) or depressed  These are common complaints, possibly related to your anesthesia, stress of surgery, and change in lifestyle, that usually go away a few weeks after surgery. If these feelings continue, call your medical doctor.  Wound Care: You may have surgical glue, steri-strips, or staples over your incisions after surgery Surgical glue: Looks like clear film over your incisions and will wear off a little at a time Steri-strips: Adhesive strips of tape over your incisions. You may notice a yellowish color on skin under the steri-strips. This is used to make the steri-strips stick better. Do not pull the steri-strips off - let them fall off Staples: Staples may be removed before you leave the hospital If you go home with staples, call [facility] for an appointment with your surgeons nurse to have staples removed 10 days after surgery, [phone] Showering: You may shower two (2) days after your surgery unless your surgeon tells you differently Wash  gently around incisions with warm soapy water, rinse well, and gently pat dry If you have a drain (tube from your incision), you may need someone to hold this while you shower No tub baths until staples are removed and incisions are healed   Medications: Medications should be liquid or crushed if larger than the size of a dime Extended release pills  (medication that releases a little bit at a time through the  day) should not be crushed Depending on the size and number of medications you take, you may need to space (take a few throughout the day)/change the time you take your medications so that you do not over-fill your pouch (smaller stomach) Make sure you follow-up with you primary care physician to make medication changes needed during rapid weight loss and life -style changes If you have diabetes, follow up with your doctor that orders your diabetes medication(s) within one week after surgery and check your blood sugar regularly  Do not drive while taking narcotics (pain medications)  Do not take acetaminophen (Tylenol) and Roxicet or Lortab Elixir at the same time since these pain medications contain acetaminophen   Diet:  First 2 Weeks You will see the nutritionist about two (2) weeks after your surgery. The nutritionist will increase the types of foods you can eat if you are handling liquids well: If you have severe vomiting or nausea and cannot handle clear liquids lasting longer than 1 day call your surgeon Protein Shake Drink at least 2 ounces of shake 5-6 times per day Each serving of protein shakes (usually 8-12 ounces) should have a minimum of: 15 grams of protein And no more than 5 grams of carbohydrate Goal for protein each day: Men = 80 grams per day Women = 60 grams per day    Protein powder may be added to fluids such as non-fat milk or Lactaid milk or Soy milk (limit to 35 grams added protein powder per serving)  Hydration Slowly increase the amount of water and other clear liquids as tolerated (See Acceptable Fluids) Slowly increase the amount of protein shake as tolerated Sip fluids slowly and throughout the day May use sugar substitutes in small amounts (no more than 6-8 packets per day; i.e. Splenda)  Fluid Goal The first goal is to drink at least 8 ounces of protein shake/drink per day (or as directed by  the nutritionist); some examples of protein shakes are Johnson & Johnson, AMR Corporation, EAS Edge HP, and Unjury. - See handout from pre-op Bariatric Education Class: Slowly increase the amount of protein shake you drink as tolerated You may find it easier to slowly sip shakes throughout the day It is important to get your proteins in first Your fluid goal is to drink 64-100 ounces of fluid daily It may take a few weeks to build up to this  32 oz. (or more) should be clear liquids And 32 oz. (or more) should be full liquids (see below for examples) Liquids should not contain sugar, caffeine, or carbonation  Clear Liquids: Water of Sugar-free flavored water (i.e. Fruit HO, Propel) Decaffeinated coffee or tea (sugar-free) Crystal lite, Wylers Lite, Minute Maid Lite Sugar-free Jell-O Bouillon or broth Sugar-free Popsicle:    - Less than 20 calories each; Limit 1 per day  Full Liquids:                   Protein Shakes/Drinks + 2 choices per day of other full liquids Full liquids must be: No More Than 12  grams of Carbs per serving No More Than 3 grams of Fat per serving Strained low-fat cream soup Non-Fat milk Fat-free Lactaid Milk Sugar-free yogurt (Dannon Lite & Fit, Greek yogurt)    Vitamins and Minerals Start 1 day after surgery unless otherwise directed by your surgeon 2 Chewable Multivitamin / Multimineral Supplement with iron (i.e. Centrum for Adults) Vitamin B-12, 350-500 micrograms sub-lingual (place tablet under the tongue) each day Chewable Calcium Citrate with Vitamin D-3 (Example: 3 Chewable Calcium  Plus 600 with Vitamin D-3) Take 500 mg three (3) times a day for a total of 1500 mg each day Do not take all 3 doses of calcium at one time as it may cause constipation, and you can only absorb 500 mg at a time Do not mix multivitamins containing iron with calcium supplements;  take 2 hours apart Do not substitute Tums (calcium carbonate) for your calcium Menstruating  women and those at risk for anemia ( a blood disease that causes weakness) may need extra iron Talk to your doctor to see if you need more iron If you need extra iron: Total daily Iron recommendation (including Vitamins) is 50 to 100 mg Iron/day Do not stop taking or change any vitamins or minerals until you talk to your nutritionist or surgeon Your nutritionist and/or surgeon must approve all vitamin and mineral supplements   Activity and Exercise: It is important to continue walking at home. Limit your physical activity as instructed by your doctor. During this time, use these guidelines: Do not lift anything greater than ten  (10) pounds for at least two (2) weeks Do not go back to work or drive until Engineer, production says you can You may have sex when you feel comfortable It is VERY important for female patients to use a reliable birth control method; fertility often increase after surgery Do not get pregnant for at least 18 months Start exercising as soon as your doctor tells you that you can Make sure your doctor approves any physical activity Start with a simple walking program Walk 5-15 minutes each day, 7 days per week Slowly increase until you are walking 30-45 minutes per day Consider joining our [place] program. [phone] or email [email or web address]   Special Instructions Things to remember: Free counseling is available for you and your family through collaboration between [facility] and [place]. Please call [phone] and leave a message Use your CPAP when sleeping if this applies to you Consider buying a medical alert bracelet that says you had lap-band surgery     You will likely have your first fill (fluid added to your band) 6 - 8 weeks after surgery [facility] has a free Bariatric Surgery Support Group that meets monthly, the 3rd Thursday, [place]. You can see classes online at [email or web address] It is very important to keep  all follow up appointments with your surgeon, nutritionist, primary care physician, and behavioral health practitioner After the first year, please follow up with your bariatric surgeon and nutritionist at least once a year in order to maintain best weight loss results                    [facility]:  [place]: [phone]               Bariatric Nurse Coordinator: [phone]  Gastric Bypass/Sleeve [place]  Instructions  Rev. 05/2012    ° °                                                    Reviewed and Endorsed °                                                   by [facility] Patient Education Committee, Jan, 2014 ° ° ° ° ° ° ° ° ° °

## 2014-06-30 ENCOUNTER — Telehealth (HOSPITAL_COMMUNITY): Payer: Self-pay

## 2014-06-30 NOTE — Telephone Encounter (Addendum)
Attempted DROP discharge phone call, no answer, Left message to return call Patient returned call over weekend, and I returned call 07/03/14 11:20 Made discharge phone call to patient per DROP protocol. Asking the following questions.    1. Do you have someone to care for you now that you are home?  yes 2. Are you having pain now that is not relieved by your pain medication?  no 3. Are you able to drink the recommended daily amount of fluids (48 ounces minimum/day) and protein (60-80 grams/day) as prescribed by the dietitian or nutritional counselor?  yes 4. Are you taking the vitamins and minerals as prescribed?  yes 5. Do you have the "on call" number to contact your surgeon if you have a problem or question?  yes 6. Are your incisions free of redness, swelling or drainage? (If steri strips, address that these can fall off, shower as tolerated) yes 7. Have your bowels moved since your surgery?  If not, are you passing gas?  yes 8. Are you up and walking 3-4 times per day?  yes    1. Do you have an appointment made to see your surgeon in the next month?  Yes 2. Were you provided your discharge medications before your surgery or before you were discharged from the hospital and are you taking them without problem?  yes 3. Were you provided phone numbers to the clinic/surgeon's office?  yes 4. Did you watch the patient education video module in the (clinic, surgeon's office, etc.) before your surgery? yes 5. Do you have a discharge checklist that was provided to you in the hospital to reference with instructions on how to take care of yourself after surgery?  yes 6. Did you see a dietitian or nutritional counselor while you were in the hospital?  yes 7. Do you have an appointment to see a dietitian or nutritional counselor in the next month?  yes

## 2014-07-11 ENCOUNTER — Encounter: Payer: BLUE CROSS/BLUE SHIELD | Attending: Surgery

## 2014-07-11 DIAGNOSIS — E669 Obesity, unspecified: Secondary | ICD-10-CM | POA: Insufficient documentation

## 2014-07-11 DIAGNOSIS — Z6838 Body mass index (BMI) 38.0-38.9, adult: Secondary | ICD-10-CM | POA: Diagnosis not present

## 2014-07-11 DIAGNOSIS — Z713 Dietary counseling and surveillance: Secondary | ICD-10-CM | POA: Diagnosis not present

## 2014-07-11 NOTE — Progress Notes (Signed)
Bariatric Class:  Appt start time: 1530 end time:  1630.  2 Week Post-Operative Nutrition Class  Patient was seen on 07/11/2014 for Post-[facility]ative [facility]   Surgery date: 06/27/2014 Surgery type: LAGB Start weight at [facility]: 224.5 lbs on 04/25/14 Weight today: 212.5 lbs  Weight change: 13 lbs  TANITA  BODY COMP RESULTS  05/29/14 07/11/14   BMI (kg/m^2) 38.7 36.5   Fat Mass (lbs) 108 107.0   Fat Free Mass (lbs) 117.5 105.5   Total Body Water (lbs) 86 77.0    The following the learning objectives were met by the patient during this course:  Identifies Phase 3A (Soft, High Proteins) Dietary Goals and will begin from 2 weeks post-operatively to 2 months post-operatively  Identifies appropriate sources of fluids and proteins   States protein recommendations and appropriate sources post-operatively  Identifies the need for appropriate texture modifications, mastication, and bite sizes when consuming solids  Identifies appropriate multivitamin and calcium sources post-operatively  Describes the need for physical activity post-operatively and will follow MD recommendations  States when to call healthcare provider regarding medication questions or post-operative complications  Handouts given during class include:  Phase 3A: Soft, High Protein Diet Handout  Follow-Up Plan: Patient will follow-up at [facility] in 6 weeks for 2 month post-op nutrition visit for diet advancement per MD.

## 2014-07-18 NOTE — Addendum Note (Signed)
Addendum  created 07/18/14 1109 by Montez Hageman, MD   Modules edited: Anesthesia Responsible Staff

## 2014-08-21 ENCOUNTER — Telehealth: Payer: Self-pay | Admitting: Dietician

## 2014-08-21 NOTE — Telephone Encounter (Signed)
Emailed the patient diet phase 3B (adding non starchy vegetables) to be started 8 weeks post op bariatric surgery. The patient verbalized that she is eating protein foods throughout the day and meeting goal. Encouraged patient to continue to meet 60-gram protein goal. Also recommended she add vegetables slowly and chew thoroughly.

## 2014-08-22 ENCOUNTER — Ambulatory Visit: Payer: BLUE CROSS/BLUE SHIELD | Admitting: Dietician

## 2014-09-06 ENCOUNTER — Encounter: Payer: Self-pay | Admitting: Dietician

## 2014-09-06 ENCOUNTER — Encounter: Payer: BLUE CROSS/BLUE SHIELD | Attending: Surgery | Admitting: Dietician

## 2014-09-06 DIAGNOSIS — Z6838 Body mass index (BMI) 38.0-38.9, adult: Secondary | ICD-10-CM | POA: Insufficient documentation

## 2014-09-06 DIAGNOSIS — E669 Obesity, unspecified: Secondary | ICD-10-CM | POA: Diagnosis not present

## 2014-09-06 DIAGNOSIS — Z713 Dietary counseling and surveillance: Secondary | ICD-10-CM | POA: Diagnosis not present

## 2014-09-06 NOTE — Patient Instructions (Signed)
Goals:  Follow Phase 3B: High Protein + Non-Starchy Vegetables  Eat 3-6 small meals/snacks, every 3-5 hrs  Increase lean protein foods to meet 60g goal  Increase fluid intake to 64oz +  Avoid drinking 15 minutes before, during and 30 minutes after eating  Aim for >30 min of physical activity daily  TANITA  BODY COMP RESULTS  05/29/14 07/11/14 09/06/14   BMI (kg/m^2) 38.7 36.5 33.9   Fat Mass (lbs) 108 107.0 89   Fat Free Mass (lbs) 117.5 105.5 108.5   Total Body Water (lbs) 86 77.0 79.5

## 2014-09-06 NOTE — Progress Notes (Signed)
  Follow-up visit:  9 Weeks Post-Operative Gastric sleeve Surgery  Medical Nutrition Therapy:  Appt start time: 315 end time:  345  Primary concerns today: Post-operative Bariatric Surgery Nutrition Management.  Zipporah returns today stating she is feeling great. She is excited for her beach trip in June and recently put on a pair of shorts for the first time in years and felt comfortable. She has difficulty tolerating chicken but is tolerating all other recommended foods. Experimenting with different bariatric-friendly recipes.   Surgery date: 06/27/2014 Surgery type: Gastric sleeve Start weight at [facility]: 224.5 lbs on 04/25/14 Weight today: 197.5 lbs Weight change: 15 lbs Total weight lost: 27 lbs Weight loss goal: 150-160 lbs  TANITA  BODY COMP RESULTS  05/29/14 07/11/14 09/06/14   BMI (kg/m^2) 38.7 36.5 33.9   Fat Mass (lbs) 108 107.0 89   Fat Free Mass (lbs) 117.5 105.5 108.5   Total Body Water (lbs) 86 77.0 79.5    Preferred Learning Style:   No preference indicated   Learning Readiness:   Ready  24-hr recall: B (8:30 AM): 1/2 deviled egg with bacon OR shake (7-30g) Snk (AM): jello or deli meat (0-7g)  L (12:30 PM): 1.5 oz shrimp or tuna salad or chicken salad (10g) Snk (PM):   D (PM): 1.5 oz meat and vegetables (10g) Snk (PM): Premier or Inspire protein shake if she feels like she is not getting enough (20-30g)  Fluid intake: decaf coffee, unsweet tea, mostly water Estimated total protein intake: patient reports meeting goal of 60 grams per day  Medications: see list; plans to have physical in June Supplementation: taking  Using straws: no Drinking while eating: no Hair loss: no Carbonated beverages: none N/V/D/C: constipation Dumping syndrome: none  Recent physical activity:  Stationary bike for 20 min, exercise bands; 1 mile walk every day  Progress Towards Goal(s):  In progress.   Nutritional Diagnosis:  [place]-3.3 Overweight/obesity related to past poor dietary  habits and physical inactivity as evidenced by patient w/ recent gastric sleeve surgery following dietary guidelines for continued weight loss.     Intervention:  Nutrition counseling provided.  Teaching Method Utilized:  Visual Auditory  Barriers to learning/adherence to lifestyle change: none  Demonstrated degree of understanding via:  Teach Back   Monitoring/Evaluation:  Dietary intake, exercise, and body weight. Follow up in 2 months for 4 month post-op visit.

## 2014-11-06 ENCOUNTER — Ambulatory Visit: Payer: BLUE CROSS/BLUE SHIELD | Admitting: Dietician

## 2014-11-28 ENCOUNTER — Other Ambulatory Visit (HOSPITAL_COMMUNITY): Payer: Self-pay | Admitting: Otolaryngology

## 2014-11-28 DIAGNOSIS — R519 Headache, unspecified: Secondary | ICD-10-CM

## 2014-11-28 DIAGNOSIS — R51 Headache: Principal | ICD-10-CM

## 2014-11-28 DIAGNOSIS — H9190 Unspecified hearing loss, unspecified ear: Secondary | ICD-10-CM

## 2014-11-29 ENCOUNTER — Ambulatory Visit (HOSPITAL_COMMUNITY)
Admission: RE | Admit: 2014-11-29 | Discharge: 2014-11-29 | Disposition: A | Discharge status: Home / Self Care | Payer: BLUE CROSS/BLUE SHIELD | Source: Ambulatory Visit | Attending: Otolaryngology | Admitting: Otolaryngology

## 2014-11-29 DIAGNOSIS — H9209 Otalgia, unspecified ear: Secondary | ICD-10-CM | POA: Insufficient documentation

## 2014-11-29 DIAGNOSIS — H9190 Unspecified hearing loss, unspecified ear: Secondary | ICD-10-CM

## 2014-11-29 DIAGNOSIS — R51 Headache: Secondary | ICD-10-CM

## 2014-11-29 DIAGNOSIS — R42 Dizziness and giddiness: Secondary | ICD-10-CM | POA: Diagnosis not present

## 2014-11-29 DIAGNOSIS — H905 Unspecified sensorineural hearing loss: Secondary | ICD-10-CM | POA: Diagnosis present

## 2014-11-29 DIAGNOSIS — R519 Headache, unspecified: Secondary | ICD-10-CM

## 2014-11-29 DIAGNOSIS — H9319 Tinnitus, unspecified ear: Secondary | ICD-10-CM | POA: Diagnosis not present

## 2014-11-29 LAB — POCT I-STAT CREATININE: Creatinine, Ser: 0.9 mg/dL (ref 0.44–1.00)

## 2014-11-29 MED ORDER — GADOBENATE DIMEGLUMINE 529 MG/ML IV SOLN
20.0000 mL | Freq: Once | INTRAVENOUS | Status: AC | PRN
  Administered 2014-11-29: 18 mL via INTRAVENOUS

## 2014-12-21 ENCOUNTER — Other Ambulatory Visit: Payer: Self-pay

## 2014-12-21 DIAGNOSIS — Z1231 Encounter for screening mammogram for malignant neoplasm of breast: Secondary | ICD-10-CM

## 2015-01-25 ENCOUNTER — Ambulatory Visit
Admission: RE | Admit: 2015-01-25 | Discharge: 2015-01-25 | Disposition: A | Discharge status: Home / Self Care | Payer: BLUE CROSS/BLUE SHIELD | Source: Ambulatory Visit

## 2015-01-25 DIAGNOSIS — Z1231 Encounter for screening mammogram for malignant neoplasm of breast: Secondary | ICD-10-CM

## 2015-12-21 ENCOUNTER — Other Ambulatory Visit: Payer: Self-pay | Admitting: Family Medicine

## 2015-12-21 DIAGNOSIS — Z1231 Encounter for screening mammogram for malignant neoplasm of breast: Secondary | ICD-10-CM

## 2016-01-29 ENCOUNTER — Encounter (HOSPITAL_COMMUNITY): Payer: Self-pay

## 2016-02-12 ENCOUNTER — Ambulatory Visit
Admission: RE | Admit: 2016-02-12 | Discharge: 2016-02-12 | Disposition: A | Discharge status: Home / Self Care | Payer: BLUE CROSS/BLUE SHIELD | Source: Ambulatory Visit | Attending: Family Medicine | Admitting: Family Medicine

## 2016-02-12 DIAGNOSIS — Z1231 Encounter for screening mammogram for malignant neoplasm of breast: Secondary | ICD-10-CM

## 2016-06-18 ENCOUNTER — Encounter: Payer: Self-pay | Admitting: Podiatry

## 2016-06-18 ENCOUNTER — Ambulatory Visit (INDEPENDENT_AMBULATORY_CARE_PROVIDER_SITE_OTHER): Payer: BLUE CROSS/BLUE SHIELD | Admitting: Podiatry

## 2016-06-18 VITALS — BP 118/76 | HR 92 | Resp 16 | Ht 64.0 in | Wt 160.0 lb

## 2016-06-18 DIAGNOSIS — L84 Corns and callosities: Secondary | ICD-10-CM

## 2016-06-18 DIAGNOSIS — L600 Ingrowing nail: Secondary | ICD-10-CM

## 2016-06-18 NOTE — Progress Notes (Signed)
   Subjective:    Patient ID: ISYS MECKES, female    DOB: 22-Jan-1956, 61 y.o.   MRN: [id number]  HPI Chief Complaint  Patient presents with  . Nail Problem    Right foot; great toe-lateral side; x1 month  . Painful Lesion    Right foot; plantar forefoot-below 5th toe; x2 months  . Knot on foot    Right foot; rearfoot (above heel); pt stated, "Just noticed, not very big and did not want an x-ray today"      Review of Systems  All other systems reviewed and are negative.      Objective:   Physical Exam        Assessment & Plan:

## 2016-06-18 NOTE — Patient Instructions (Signed)

## 2016-06-18 NOTE — Progress Notes (Signed)
Subjective:     Patient ID: MAYUMI RIDDLES, female   DOB: 1956/01/22, 61 y.o.   MRN: [id number]  HPI patient presents with painful ingrown toenail deformity right hallux that she cannot cut out herself and also has a lesion sub-fourth metatarsal right   Review of Systems  All other systems reviewed and are negative.      Objective:   Physical Exam  Constitutional: She is oriented to person, place, and time.  Cardiovascular: Intact distal pulses.   Musculoskeletal: Normal range of motion.  Neurological: She is oriented to person, place, and time.  Skin: Skin is warm.  Nursing note and vitals reviewed.  neurovascular status intact muscle strength adequate range of motion within normal limits with patient found have incurvated right hallux that's painful when pressed and keratotic lesion sub-fourth metatarsal that's painful     Assessment:     Ingrown toenail deformity right hallux border and lesion formation    Plan:     H&P conditions reviewed and a correction of border. I explained procedure and risk and today infiltrated the right hallux 60 [place] segment Marcaine mixture remove the border exposed matrix and applied phenol 3 applications 30 seconds followed by alcohol lavaged sterile dressing. Gave instructions on soaks and reappoint

## 2017-02-02 ENCOUNTER — Other Ambulatory Visit: Payer: Self-pay | Admitting: Family Medicine

## 2017-02-02 DIAGNOSIS — Z1231 Encounter for screening mammogram for malignant neoplasm of breast: Secondary | ICD-10-CM

## 2017-02-23 ENCOUNTER — Ambulatory Visit: Payer: BLUE CROSS/BLUE SHIELD

## 2017-03-20 ENCOUNTER — Ambulatory Visit: Payer: Self-pay

## 2017-05-06 ENCOUNTER — Ambulatory Visit
Admission: RE | Admit: 2017-05-06 | Discharge: 2017-05-06 | Disposition: A | Discharge status: Home / Self Care | Payer: BLUE CROSS/BLUE SHIELD | Source: Ambulatory Visit | Attending: Family Medicine | Admitting: Family Medicine

## 2017-05-06 DIAGNOSIS — Z1231 Encounter for screening mammogram for malignant neoplasm of breast: Secondary | ICD-10-CM

## 2018-03-25 ENCOUNTER — Encounter (HOSPITAL_COMMUNITY): Payer: Self-pay | Admitting: Emergency Medicine

## 2018-03-25 ENCOUNTER — Emergency Department (HOSPITAL_COMMUNITY)
Admission: EM | Admit: 2018-03-25 | Discharge: 2018-03-25 | Disposition: A | Discharge status: Home / Self Care | Payer: BLUE CROSS/BLUE SHIELD | Attending: Emergency Medicine | Admitting: Emergency Medicine

## 2018-03-25 DIAGNOSIS — Z79899 Other long term (current) drug therapy: Secondary | ICD-10-CM | POA: Diagnosis not present

## 2018-03-25 DIAGNOSIS — Z87891 Personal history of nicotine dependence: Secondary | ICD-10-CM | POA: Diagnosis not present

## 2018-03-25 DIAGNOSIS — I10 Essential (primary) hypertension: Secondary | ICD-10-CM | POA: Insufficient documentation

## 2018-03-25 DIAGNOSIS — R42 Dizziness and giddiness: Secondary | ICD-10-CM | POA: Diagnosis not present

## 2018-03-25 LAB — CBC
HCT: 35.2 % — ABNORMAL LOW (ref 36.0–46.0)
HEMOGLOBIN: 11.9 g/dL — AB (ref 12.0–15.0)
MCH: 34.4 pg — AB (ref 26.0–34.0)
MCHC: 33.8 g/dL (ref 30.0–36.0)
MCV: 101.7 fL — ABNORMAL HIGH (ref 80.0–100.0)
NRBC: 0 % (ref 0.0–0.2)
PLATELETS: 179 10*3/uL (ref 150–400)
RBC: 3.46 MIL/uL — AB (ref 3.87–5.11)
RDW: 11.7 % (ref 11.5–15.5)
WBC: 6.9 10*3/uL (ref 4.0–10.5)

## 2018-03-25 LAB — BASIC METABOLIC PANEL
ANION GAP: 10 (ref 5–15)
BUN: 19 mg/dL (ref 8–23)
CO2: 25 mmol/L (ref 22–32)
Calcium: 9.1 mg/dL (ref 8.9–10.3)
Chloride: 103 mmol/L (ref 98–111)
Creatinine, Ser: 0.86 mg/dL (ref 0.44–1.00)
GFR calc Af Amer: >60 mL/min (ref >60)
GLUCOSE: 101 mg/dL — AB (ref 70–99)
Potassium: 4.3 mmol/L (ref 3.5–5.1)
Sodium: 138 mmol/L (ref 135–145)

## 2018-03-25 LAB — URINALYSIS, ROUTINE W REFLEX MICROSCOPIC
BILIRUBIN URINE: NEGATIVE
Glucose, UA: NEGATIVE mg/dL
Hgb urine dipstick: NEGATIVE
Ketones, ur: 5 mg/dL — AB
LEUKOCYTES UA: NEGATIVE
Nitrite: NEGATIVE
PROTEIN: NEGATIVE mg/dL
Specific Gravity, Urine: 1.017 (ref 1.005–1.030)
pH: 8 (ref 5.0–8.0)

## 2018-03-25 MED ORDER — SODIUM CHLORIDE 0.9 % IV BOLUS
500.0000 mL | Freq: Once | INTRAVENOUS | Status: AC
  Administered 2018-03-25: 500 mL via INTRAVENOUS

## 2018-03-25 MED ORDER — MECLIZINE HCL 25 MG PO TABS
25.0000 mg | ORAL_TABLET | Freq: Once | ORAL | Status: AC
  Administered 2018-03-25: 25 mg via ORAL
  Filled 2018-03-25: qty 1

## 2018-03-25 MED ORDER — ONDANSETRON HCL 4 MG PO TABS: 4.0000 mg | ORAL_TABLET | Freq: Four times a day (QID) | ORAL | 0 refills | Status: AC

## 2018-03-25 MED ORDER — MECLIZINE HCL 25 MG PO TABS: 25.0000 mg | ORAL_TABLET | Freq: Three times a day (TID) | ORAL | 1 refills | Status: DC | PRN

## 2018-03-25 NOTE — ED Provider Notes (Signed)
[facility] DEPT Provider Note   CSN: [id number] Arrival date & time: 03/25/18  0945     History   Chief Complaint Chief Complaint  Patient presents with  . Nausea  . Dizziness    HPI Anna Castillo is a 62 y.o. female.  62 year old female with prior medical history as detailed below presents for evaluation of dizziness.  Patient reports that she was at work when she suddenly began to feel very dizzy.  She reports that she felt like the room spinning.  She had associated nausea and threw up once.  Symptoms are significantly better now upon arrival to the ED.  She denies associated headache, vision change, focal weakness, neck pain, chest pain, shortness of breath, or other complaint.  She denies prior episodes of vertigo.  Her most significant symptoms lasted approximately 30 minutes.  Symptoms were worse with movement of her head or rapid changing of her position.    The history is provided by the patient and medical records.  Dizziness  Quality:  Room spinning and vertigo Severity:  Mild Onset quality:  Sudden Duration:  1 hour Timing:  Unable to specify Progression:  Improving Chronicity:  New Context: bending over and head movement   Relieved by:  Nothing Worsened by:  Nothing Ineffective treatments:  None tried   Past Medical History:  Diagnosis Date  . Anxiety   . Arthritis   . Chronic back pain   . Dizziness   . GERD (gastroesophageal reflux disease)   . Heart palpitations   . Hypercholesterolemia   . Hypertension   . Lightheadedness   . Obesity   . PONV (postoperative nausea and vomiting)   . Post-menopausal    at age 24  . SOB (shortness of breath)    climbing stairs  . Tachycardia     Patient Active Problem List   Diagnosis Date Noted  . Morbid obesity ([place]) 06/27/2014    Past Surgical History:  Procedure Laterality Date  . BACK SURGERY    . BREATH TEK H PYLORI N/A 05/02/2014   Procedure: BREATH TEK H PYLORI;   Surgeon: Alphonsa Overall, MD;  Location: [facility];  Service: General;  Laterality: N/A;  . CESAREAN SECTION    . CESAREAN SECTION    . CHOLECYSTECTOMY    . colonscopy     . LAPAROSCOPIC GASTRIC SLEEVE RESECTION N/A 06/27/2014   Procedure: LAPAROSCOPIC SLEEVE GASTRETOMY WITH UPPER ENDOSCOPY;  Surgeon: Alphonsa Overall, MD;  Location: [facility];  Service: General;  Laterality: N/A;  . PARTIAL HYSTERECTOMY    . TONSILLECTOMY    . TONSILLECTOMY       OB History   None      Home Medications    Prior to Admission medications   Medication Sig Start Date End Date Taking? Authorizing Provider  ALPRAZolam Duanne Moron) 0.25 MG tablet Take 0.25 mg by mouth at bedtime as needed for sleep.   Yes [provider]  atorvastatin (LIPITOR) 10 MG tablet Take 10 mg by mouth daily. 01/13/18  Yes [provider]  buPROPion (WELLBUTRIN SR) 150 MG 12 hr tablet Take 150 mg by mouth daily. 01/13/18  Yes [provider]  estradiol (ESTRACE) 0.5 MG tablet Take 0.5 mg by mouth daily.   Yes [provider]  LINZESS 145 MCG CAPS capsule Take 145 mcg by mouth every evening. 03/10/18  Yes [provider]  omeprazole (PRILOSEC OTC) 20 MG tablet Take 20 mg by mouth daily.   Yes [provider]  phentermine (ADIPEX-P) 37.5 MG tablet Take 37.5 mg by mouth daily before breakfast.   Yes [provider]  vitamin E (VITAMIN E) 400 UNIT capsule Take 400 Units by mouth daily.   Yes [provider]  meclizine (ANTIVERT) 25 MG tablet Take 1 tablet (25 mg total) by mouth 3 (three) times daily as needed for dizziness. 03/25/18   Valarie Merino, MD  ondansetron (ZOFRAN) 4 MG tablet Take 1 tablet (4 mg total) by mouth every 6 (six) hours. 03/25/18   Valarie Merino, MD    Family History Family History  Problem Relation Age of Onset  . Hypertension Father   . Diabetes Father   . Breast cancer Mother   . Ovarian cancer Sister   . Breast cancer Sister     Social  History Social History   Tobacco Use  . Smoking status: Former Smoker    Packs/day: 0.50    Years: 30.00    Pack years: 15.00    Types: Cigarettes    Last attempt to quit: 03/14/2014    Years since quitting: 4.0  . Smokeless tobacco: Never Used  Substance Use Topics  . Alcohol use: Yes    Alcohol/week: 0.0 standard drinks    Comment: weekends drinks 3 glasses of wine each time   . Drug use: No     Allergies   Hydrocodone and Penicillins   Review of Systems Review of Systems  Neurological: Positive for dizziness.  All other systems reviewed and are negative.    Physical Exam Updated Vital Signs BP (!) 149/93   Pulse 95   Temp 97.6 F (36.4 C) (Oral)   Resp (!) 22   SpO2 99%   Physical Exam  Constitutional: She is oriented to person, place, and time. She appears well-developed and well-nourished. No distress.  HENT:  Head: Normocephalic and atraumatic.  Mouth/Throat: Oropharynx is clear and moist.  Eyes: Pupils are equal, round, and reactive to light. Conjunctivae and EOM are normal.  Neck: Normal range of motion. Neck supple.  Cardiovascular: Normal rate, regular rhythm and normal heart sounds.  Pulmonary/Chest: Effort normal and breath sounds normal. No respiratory distress.  Abdominal: Soft. She exhibits no distension. There is no tenderness.  Musculoskeletal: Normal range of motion. She exhibits no edema or deformity.  Neurological: She is alert and oriented to person, place, and time. She displays normal reflexes. No cranial nerve deficit or sensory deficit. She exhibits normal muscle tone. Coordination normal.  Mild bilateral horizontal nystagmus   Skin: Skin is warm and dry.  Psychiatric: She has a normal mood and affect.  Nursing note and vitals reviewed.    ED Treatments / Results  Labs (all labs ordered are listed, but only abnormal results are displayed) Labs Reviewed  BASIC METABOLIC PANEL - Abnormal; Notable for the following components:       Result Value   Glucose, Bld 101 (*)    All other components within normal limits  URINALYSIS, ROUTINE W REFLEX MICROSCOPIC - Abnormal; Notable for the following components:   APPearance HAZY (*)    Ketones, ur 5 (*)    All other components within normal limits  CBC - Abnormal; Notable for the following components:   RBC 3.46 (*)    Hemoglobin 11.9 (*)    HCT 35.2 (*)    MCV 101.7 (*)    MCH 34.4 (*)    All other components within normal limits    EKG EKG Interpretation  Date/Time:  Thursday  March 25 2018 09:56:44 EST Ventricular Rate:  95 PR Interval:    QRS Duration: 89 QT Interval:  366 QTC Calculation: 461 R Axis:   83 Text Interpretation:  Sinus rhythm Consider left atrial enlargement Borderline right axis deviation Confirmed by Dene Gentry [phone]) on 03/25/2018 10:56:56 AM   Radiology No results found.  Procedures Procedures (including critical care time)  Medications Ordered in ED Medications  sodium chloride 0.9 % bolus 500 mL (500 mLs Intravenous New Bag/Given 03/25/18 1143)  meclizine (ANTIVERT) tablet 25 mg (25 mg Oral Given 03/25/18 1136)     Initial Impression / Assessment and Plan / ED Course  I have reviewed the triage vital signs and the nursing notes.  Pertinent labs & imaging results that were available during my care of the patient were reviewed by me and considered in my medical decision making (see chart for details).     MDM  Screen complete  Patient is presenting for evaluation of dizziness.  Patient's described symptoms are consistent with benign vertigo.  She feels significantly improved following administration of meclizine and Zofran in the ED.  She is able to ambulate without difficulty.  She denies significant symptoms at time of discharge.  She understands the need for close follow-up.  Strict return precautions given and understood.  Final Clinical Impressions(s) / ED Diagnoses   Final diagnoses:  Vertigo    ED Discharge  Orders         Ordered    meclizine (ANTIVERT) 25 MG tablet  3 times daily PRN     03/25/18 1300    ondansetron (ZOFRAN) 4 MG tablet  Every 6 hours     03/25/18 1300           Valarie Merino, MD 03/25/18 1305

## 2018-03-25 NOTE — ED Triage Notes (Signed)
Per EMS-states patient became nauseous and dizzy while at work this am-EKG normal-20g left FA-8 mg of Zofan given in route-CBG 136-BP 136/84, HR 93

## 2018-03-25 NOTE — Discharge Instructions (Signed)
Please return for any problem.  Follow-up with your regular care provider as instructed. °

## 2018-04-09 ENCOUNTER — Other Ambulatory Visit: Payer: Self-pay | Admitting: Family Medicine

## 2018-04-09 DIAGNOSIS — Z1231 Encounter for screening mammogram for malignant neoplasm of breast: Secondary | ICD-10-CM

## 2018-05-13 ENCOUNTER — Ambulatory Visit: Payer: BLUE CROSS/BLUE SHIELD

## 2018-05-17 ENCOUNTER — Ambulatory Visit
Admission: RE | Admit: 2018-05-17 | Discharge: 2018-05-17 | Disposition: A | Discharge status: Home / Self Care | Payer: BLUE CROSS/BLUE SHIELD | Source: Ambulatory Visit | Attending: Family Medicine | Admitting: Family Medicine

## 2018-05-17 DIAGNOSIS — Z1231 Encounter for screening mammogram for malignant neoplasm of breast: Secondary | ICD-10-CM

## 2019-07-12 ENCOUNTER — Other Ambulatory Visit: Payer: Self-pay | Admitting: Family Medicine

## 2019-07-12 DIAGNOSIS — Z1231 Encounter for screening mammogram for malignant neoplasm of breast: Secondary | ICD-10-CM

## 2019-08-12 ENCOUNTER — Ambulatory Visit: Payer: BLUE CROSS/BLUE SHIELD

## 2019-08-18 ENCOUNTER — Ambulatory Visit
Admission: RE | Admit: 2019-08-18 | Discharge: 2019-08-18 | Disposition: A | Discharge status: Home / Self Care | Payer: BC Managed Care – PPO | Source: Ambulatory Visit | Attending: Family Medicine | Admitting: Family Medicine

## 2019-08-18 ENCOUNTER — Other Ambulatory Visit: Payer: Self-pay

## 2019-08-18 DIAGNOSIS — Z1231 Encounter for screening mammogram for malignant neoplasm of breast: Secondary | ICD-10-CM

## 2019-12-23 ENCOUNTER — Other Ambulatory Visit: Payer: Self-pay | Admitting: Family Medicine

## 2019-12-23 DIAGNOSIS — M8580 Other specified disorders of bone density and structure, unspecified site: Secondary | ICD-10-CM

## 2020-04-06 ENCOUNTER — Other Ambulatory Visit: Payer: BC Managed Care – PPO

## 2020-07-10 ENCOUNTER — Ambulatory Visit
Admission: RE | Admit: 2020-07-10 | Discharge: 2020-07-10 | Disposition: A | Discharge status: Home / Self Care | Payer: BC Managed Care – PPO | Source: Ambulatory Visit | Attending: Family Medicine | Admitting: Family Medicine

## 2020-07-10 ENCOUNTER — Other Ambulatory Visit: Payer: Self-pay

## 2020-07-10 DIAGNOSIS — M8580 Other specified disorders of bone density and structure, unspecified site: Secondary | ICD-10-CM

## 2020-08-14 ENCOUNTER — Other Ambulatory Visit: Payer: Self-pay | Admitting: Family Medicine

## 2020-08-14 DIAGNOSIS — Z1231 Encounter for screening mammogram for malignant neoplasm of breast: Secondary | ICD-10-CM

## 2020-10-09 ENCOUNTER — Ambulatory Visit: Payer: BC Managed Care – PPO

## 2020-10-15 ENCOUNTER — Other Ambulatory Visit: Payer: Self-pay

## 2020-10-15 ENCOUNTER — Ambulatory Visit
Admission: RE | Admit: 2020-10-15 | Discharge: 2020-10-15 | Disposition: A | Discharge status: Home / Self Care | Payer: BC Managed Care – PPO | Source: Ambulatory Visit | Attending: Family Medicine | Admitting: Family Medicine

## 2020-10-15 DIAGNOSIS — Z1231 Encounter for screening mammogram for malignant neoplasm of breast: Secondary | ICD-10-CM

## 2021-07-08 DIAGNOSIS — F419 Anxiety disorder, unspecified: Secondary | ICD-10-CM | POA: Diagnosis not present

## 2021-07-08 DIAGNOSIS — R69 Illness, unspecified: Secondary | ICD-10-CM | POA: Diagnosis not present

## 2021-07-08 DIAGNOSIS — Z7689 Persons encountering health services in other specified circumstances: Secondary | ICD-10-CM | POA: Diagnosis not present

## 2021-07-08 DIAGNOSIS — G250 Essential tremor: Secondary | ICD-10-CM | POA: Diagnosis not present

## 2021-07-08 DIAGNOSIS — I10 Essential (primary) hypertension: Secondary | ICD-10-CM | POA: Diagnosis not present

## 2021-07-08 DIAGNOSIS — E785 Hyperlipidemia, unspecified: Secondary | ICD-10-CM | POA: Diagnosis not present

## 2021-07-08 DIAGNOSIS — F3342 Major depressive disorder, recurrent, in full remission: Secondary | ICD-10-CM | POA: Diagnosis not present

## 2021-07-08 DIAGNOSIS — K219 Gastro-esophageal reflux disease without esophagitis: Secondary | ICD-10-CM | POA: Diagnosis not present

## 2021-07-30 DIAGNOSIS — I10 Essential (primary) hypertension: Secondary | ICD-10-CM | POA: Diagnosis not present

## 2021-07-30 DIAGNOSIS — R69 Illness, unspecified: Secondary | ICD-10-CM | POA: Diagnosis not present

## 2021-07-30 DIAGNOSIS — G250 Essential tremor: Secondary | ICD-10-CM | POA: Diagnosis not present

## 2021-07-30 DIAGNOSIS — F3342 Major depressive disorder, recurrent, in full remission: Secondary | ICD-10-CM | POA: Diagnosis not present

## 2021-08-13 DIAGNOSIS — M545 Low back pain, unspecified: Secondary | ICD-10-CM | POA: Diagnosis not present

## 2021-08-14 DIAGNOSIS — H2513 Age-related nuclear cataract, bilateral: Secondary | ICD-10-CM | POA: Diagnosis not present

## 2021-08-14 DIAGNOSIS — H524 Presbyopia: Secondary | ICD-10-CM | POA: Diagnosis not present

## 2021-08-14 DIAGNOSIS — H5203 Hypermetropia, bilateral: Secondary | ICD-10-CM | POA: Diagnosis not present

## 2021-08-30 DIAGNOSIS — G250 Essential tremor: Secondary | ICD-10-CM | POA: Diagnosis not present

## 2021-08-30 DIAGNOSIS — J029 Acute pharyngitis, unspecified: Secondary | ICD-10-CM | POA: Diagnosis not present

## 2021-08-30 DIAGNOSIS — I10 Essential (primary) hypertension: Secondary | ICD-10-CM | POA: Diagnosis not present

## 2021-08-30 DIAGNOSIS — R682 Dry mouth, unspecified: Secondary | ICD-10-CM | POA: Diagnosis not present

## 2021-09-05 DIAGNOSIS — I10 Essential (primary) hypertension: Secondary | ICD-10-CM | POA: Diagnosis not present

## 2021-09-05 DIAGNOSIS — G250 Essential tremor: Secondary | ICD-10-CM | POA: Diagnosis not present

## 2021-09-05 DIAGNOSIS — B9689 Other specified bacterial agents as the cause of diseases classified elsewhere: Secondary | ICD-10-CM | POA: Diagnosis not present

## 2021-09-05 DIAGNOSIS — J019 Acute sinusitis, unspecified: Secondary | ICD-10-CM | POA: Diagnosis not present

## 2021-09-05 DIAGNOSIS — R000 Tachycardia, unspecified: Secondary | ICD-10-CM | POA: Diagnosis not present

## 2021-09-06 DIAGNOSIS — D649 Anemia, unspecified: Secondary | ICD-10-CM | POA: Diagnosis not present

## 2021-09-10 DIAGNOSIS — M961 Postlaminectomy syndrome, not elsewhere classified: Secondary | ICD-10-CM | POA: Diagnosis not present

## 2021-09-13 DIAGNOSIS — D649 Anemia, unspecified: Secondary | ICD-10-CM | POA: Diagnosis not present

## 2021-09-13 DIAGNOSIS — G250 Essential tremor: Secondary | ICD-10-CM | POA: Diagnosis not present

## 2021-09-13 DIAGNOSIS — I10 Essential (primary) hypertension: Secondary | ICD-10-CM | POA: Diagnosis not present

## 2021-09-17 DIAGNOSIS — I10 Essential (primary) hypertension: Secondary | ICD-10-CM | POA: Diagnosis not present

## 2021-09-17 DIAGNOSIS — R7989 Other specified abnormal findings of blood chemistry: Secondary | ICD-10-CM | POA: Diagnosis not present

## 2021-09-17 DIAGNOSIS — D7589 Other specified diseases of blood and blood-forming organs: Secondary | ICD-10-CM | POA: Diagnosis not present

## 2021-09-17 DIAGNOSIS — D539 Nutritional anemia, unspecified: Secondary | ICD-10-CM | POA: Diagnosis not present

## 2021-10-08 DIAGNOSIS — Z09 Encounter for follow-up examination after completed treatment for conditions other than malignant neoplasm: Secondary | ICD-10-CM | POA: Diagnosis not present

## 2021-10-08 DIAGNOSIS — D638 Anemia in other chronic diseases classified elsewhere: Secondary | ICD-10-CM | POA: Diagnosis not present

## 2021-10-08 DIAGNOSIS — I10 Essential (primary) hypertension: Secondary | ICD-10-CM | POA: Diagnosis not present

## 2021-10-09 DIAGNOSIS — K219 Gastro-esophageal reflux disease without esophagitis: Secondary | ICD-10-CM | POA: Diagnosis not present

## 2021-10-09 DIAGNOSIS — K912 Postsurgical malabsorption, not elsewhere classified: Secondary | ICD-10-CM | POA: Diagnosis not present

## 2021-10-09 DIAGNOSIS — Z903 Acquired absence of stomach [part of]: Secondary | ICD-10-CM | POA: Diagnosis not present

## 2021-10-09 DIAGNOSIS — Z9884 Bariatric surgery status: Secondary | ICD-10-CM | POA: Diagnosis not present

## 2021-10-09 DIAGNOSIS — D638 Anemia in other chronic diseases classified elsewhere: Secondary | ICD-10-CM | POA: Diagnosis not present

## 2021-10-09 DIAGNOSIS — I10 Essential (primary) hypertension: Secondary | ICD-10-CM | POA: Diagnosis not present

## 2021-10-10 DIAGNOSIS — K912 Postsurgical malabsorption, not elsewhere classified: Secondary | ICD-10-CM | POA: Diagnosis not present

## 2021-10-10 DIAGNOSIS — Z9884 Bariatric surgery status: Secondary | ICD-10-CM | POA: Diagnosis not present

## 2021-10-10 DIAGNOSIS — N281 Cyst of kidney, acquired: Secondary | ICD-10-CM | POA: Diagnosis not present

## 2021-10-10 DIAGNOSIS — Z903 Acquired absence of stomach [part of]: Secondary | ICD-10-CM | POA: Diagnosis not present

## 2021-10-10 DIAGNOSIS — D638 Anemia in other chronic diseases classified elsewhere: Secondary | ICD-10-CM | POA: Diagnosis not present

## 2021-10-10 DIAGNOSIS — K760 Fatty (change of) liver, not elsewhere classified: Secondary | ICD-10-CM | POA: Diagnosis not present

## 2021-10-10 DIAGNOSIS — Z9049 Acquired absence of other specified parts of digestive tract: Secondary | ICD-10-CM | POA: Diagnosis not present

## 2021-10-11 DIAGNOSIS — D638 Anemia in other chronic diseases classified elsewhere: Secondary | ICD-10-CM | POA: Diagnosis not present

## 2021-10-15 ENCOUNTER — Other Ambulatory Visit: Payer: Self-pay | Admitting: Internal Medicine

## 2021-10-15 DIAGNOSIS — Z1231 Encounter for screening mammogram for malignant neoplasm of breast: Secondary | ICD-10-CM

## 2021-10-23 ENCOUNTER — Ambulatory Visit: Payer: BC Managed Care – PPO

## 2021-10-30 DIAGNOSIS — F3342 Major depressive disorder, recurrent, in full remission: Secondary | ICD-10-CM | POA: Diagnosis not present

## 2021-10-30 DIAGNOSIS — R69 Illness, unspecified: Secondary | ICD-10-CM | POA: Diagnosis not present

## 2021-10-30 DIAGNOSIS — D638 Anemia in other chronic diseases classified elsewhere: Secondary | ICD-10-CM | POA: Diagnosis not present

## 2021-10-30 DIAGNOSIS — I10 Essential (primary) hypertension: Secondary | ICD-10-CM | POA: Diagnosis not present

## 2021-10-30 DIAGNOSIS — R070 Pain in throat: Secondary | ICD-10-CM | POA: Diagnosis not present

## 2021-10-31 ENCOUNTER — Ambulatory Visit
Admission: RE | Admit: 2021-10-31 | Discharge: 2021-10-31 | Disposition: A | Discharge status: Home / Self Care | Payer: Medicare HMO | Source: Ambulatory Visit | Attending: Internal Medicine | Admitting: Internal Medicine

## 2021-10-31 DIAGNOSIS — Z1231 Encounter for screening mammogram for malignant neoplasm of breast: Secondary | ICD-10-CM | POA: Diagnosis not present

## 2021-11-04 DIAGNOSIS — D638 Anemia in other chronic diseases classified elsewhere: Secondary | ICD-10-CM | POA: Diagnosis not present

## 2021-11-04 DIAGNOSIS — D649 Anemia, unspecified: Secondary | ICD-10-CM | POA: Diagnosis not present

## 2021-11-04 DIAGNOSIS — I10 Essential (primary) hypertension: Secondary | ICD-10-CM | POA: Diagnosis not present

## 2021-11-04 DIAGNOSIS — E785 Hyperlipidemia, unspecified: Secondary | ICD-10-CM | POA: Diagnosis not present

## 2021-11-06 DIAGNOSIS — D649 Anemia, unspecified: Secondary | ICD-10-CM | POA: Diagnosis not present

## 2021-11-06 DIAGNOSIS — M25562 Pain in left knee: Secondary | ICD-10-CM | POA: Diagnosis not present

## 2021-11-06 DIAGNOSIS — I10 Essential (primary) hypertension: Secondary | ICD-10-CM | POA: Diagnosis not present

## 2021-11-06 DIAGNOSIS — K625 Hemorrhage of anus and rectum: Secondary | ICD-10-CM | POA: Diagnosis not present

## 2021-11-06 DIAGNOSIS — R1013 Epigastric pain: Secondary | ICD-10-CM | POA: Diagnosis not present

## 2021-11-06 DIAGNOSIS — M25561 Pain in right knee: Secondary | ICD-10-CM | POA: Diagnosis not present

## 2021-11-06 DIAGNOSIS — K219 Gastro-esophageal reflux disease without esophagitis: Secondary | ICD-10-CM | POA: Diagnosis not present

## 2021-11-06 DIAGNOSIS — K760 Fatty (change of) liver, not elsewhere classified: Secondary | ICD-10-CM | POA: Diagnosis not present

## 2021-11-12 DIAGNOSIS — K219 Gastro-esophageal reflux disease without esophagitis: Secondary | ICD-10-CM | POA: Diagnosis not present

## 2021-11-12 DIAGNOSIS — K5900 Constipation, unspecified: Secondary | ICD-10-CM | POA: Diagnosis not present

## 2021-11-12 DIAGNOSIS — Z1211 Encounter for screening for malignant neoplasm of colon: Secondary | ICD-10-CM | POA: Diagnosis not present

## 2021-11-12 DIAGNOSIS — D509 Iron deficiency anemia, unspecified: Secondary | ICD-10-CM | POA: Diagnosis not present

## 2021-11-13 DIAGNOSIS — D638 Anemia in other chronic diseases classified elsewhere: Secondary | ICD-10-CM | POA: Diagnosis not present

## 2021-11-21 DIAGNOSIS — I10 Essential (primary) hypertension: Secondary | ICD-10-CM | POA: Diagnosis not present

## 2021-11-21 DIAGNOSIS — R69 Illness, unspecified: Secondary | ICD-10-CM | POA: Diagnosis not present

## 2021-11-21 DIAGNOSIS — D638 Anemia in other chronic diseases classified elsewhere: Secondary | ICD-10-CM | POA: Diagnosis not present

## 2021-11-21 DIAGNOSIS — K219 Gastro-esophageal reflux disease without esophagitis: Secondary | ICD-10-CM | POA: Diagnosis not present

## 2021-11-27 DIAGNOSIS — D509 Iron deficiency anemia, unspecified: Secondary | ICD-10-CM | POA: Diagnosis not present

## 2021-11-27 DIAGNOSIS — Z1211 Encounter for screening for malignant neoplasm of colon: Secondary | ICD-10-CM | POA: Diagnosis not present

## 2021-11-27 DIAGNOSIS — K449 Diaphragmatic hernia without obstruction or gangrene: Secondary | ICD-10-CM | POA: Diagnosis not present

## 2021-11-27 DIAGNOSIS — K319 Disease of stomach and duodenum, unspecified: Secondary | ICD-10-CM | POA: Diagnosis not present

## 2021-11-27 DIAGNOSIS — K219 Gastro-esophageal reflux disease without esophagitis: Secondary | ICD-10-CM | POA: Diagnosis not present

## 2021-11-27 DIAGNOSIS — K2960 Other gastritis without bleeding: Secondary | ICD-10-CM | POA: Diagnosis not present

## 2021-11-27 DIAGNOSIS — K317 Polyp of stomach and duodenum: Secondary | ICD-10-CM | POA: Diagnosis not present

## 2021-11-28 DIAGNOSIS — R69 Illness, unspecified: Secondary | ICD-10-CM | POA: Diagnosis not present

## 2021-11-28 DIAGNOSIS — D638 Anemia in other chronic diseases classified elsewhere: Secondary | ICD-10-CM | POA: Diagnosis not present

## 2021-11-28 DIAGNOSIS — D472 Monoclonal gammopathy: Secondary | ICD-10-CM | POA: Diagnosis not present

## 2021-11-28 DIAGNOSIS — D649 Anemia, unspecified: Secondary | ICD-10-CM | POA: Diagnosis not present

## 2021-11-28 DIAGNOSIS — D708 Other neutropenia: Secondary | ICD-10-CM | POA: Diagnosis not present

## 2021-11-28 DIAGNOSIS — I10 Essential (primary) hypertension: Secondary | ICD-10-CM | POA: Diagnosis not present

## 2021-12-03 DIAGNOSIS — R0989 Other specified symptoms and signs involving the circulatory and respiratory systems: Secondary | ICD-10-CM | POA: Diagnosis not present

## 2021-12-03 DIAGNOSIS — M542 Cervicalgia: Secondary | ICD-10-CM | POA: Diagnosis not present

## 2021-12-03 DIAGNOSIS — K219 Gastro-esophageal reflux disease without esophagitis: Secondary | ICD-10-CM | POA: Diagnosis not present

## 2021-12-06 DIAGNOSIS — L570 Actinic keratosis: Secondary | ICD-10-CM | POA: Diagnosis not present

## 2021-12-06 DIAGNOSIS — L821 Other seborrheic keratosis: Secondary | ICD-10-CM | POA: Diagnosis not present

## 2021-12-06 DIAGNOSIS — D225 Melanocytic nevi of trunk: Secondary | ICD-10-CM | POA: Diagnosis not present

## 2021-12-06 DIAGNOSIS — Z85828 Personal history of other malignant neoplasm of skin: Secondary | ICD-10-CM | POA: Diagnosis not present

## 2021-12-06 DIAGNOSIS — L814 Other melanin hyperpigmentation: Secondary | ICD-10-CM | POA: Diagnosis not present

## 2021-12-06 DIAGNOSIS — L578 Other skin changes due to chronic exposure to nonionizing radiation: Secondary | ICD-10-CM | POA: Diagnosis not present

## 2021-12-10 DIAGNOSIS — N281 Cyst of kidney, acquired: Secondary | ICD-10-CM | POA: Diagnosis not present

## 2021-12-10 DIAGNOSIS — R918 Other nonspecific abnormal finding of lung field: Secondary | ICD-10-CM | POA: Diagnosis not present

## 2021-12-10 DIAGNOSIS — Z981 Arthrodesis status: Secondary | ICD-10-CM | POA: Diagnosis not present

## 2021-12-10 DIAGNOSIS — Z9049 Acquired absence of other specified parts of digestive tract: Secondary | ICD-10-CM | POA: Diagnosis not present

## 2021-12-10 DIAGNOSIS — C9000 Multiple myeloma not having achieved remission: Secondary | ICD-10-CM | POA: Diagnosis not present

## 2021-12-12 DIAGNOSIS — D638 Anemia in other chronic diseases classified elsewhere: Secondary | ICD-10-CM | POA: Diagnosis not present

## 2021-12-12 DIAGNOSIS — C9000 Multiple myeloma not having achieved remission: Secondary | ICD-10-CM | POA: Diagnosis not present

## 2021-12-19 DIAGNOSIS — Z90710 Acquired absence of both cervix and uterus: Secondary | ICD-10-CM | POA: Diagnosis not present

## 2021-12-19 DIAGNOSIS — R69 Illness, unspecified: Secondary | ICD-10-CM | POA: Diagnosis not present

## 2021-12-19 DIAGNOSIS — C9000 Multiple myeloma not having achieved remission: Secondary | ICD-10-CM | POA: Diagnosis not present

## 2021-12-19 DIAGNOSIS — D638 Anemia in other chronic diseases classified elsewhere: Secondary | ICD-10-CM | POA: Diagnosis not present

## 2021-12-19 DIAGNOSIS — K219 Gastro-esophageal reflux disease without esophagitis: Secondary | ICD-10-CM | POA: Diagnosis not present

## 2021-12-19 DIAGNOSIS — D708 Other neutropenia: Secondary | ICD-10-CM | POA: Diagnosis not present

## 2021-12-19 DIAGNOSIS — I10 Essential (primary) hypertension: Secondary | ICD-10-CM | POA: Diagnosis not present

## 2021-12-20 DIAGNOSIS — R69 Illness, unspecified: Secondary | ICD-10-CM | POA: Diagnosis not present

## 2021-12-20 DIAGNOSIS — F3342 Major depressive disorder, recurrent, in full remission: Secondary | ICD-10-CM | POA: Diagnosis not present

## 2021-12-20 DIAGNOSIS — J189 Pneumonia, unspecified organism: Secondary | ICD-10-CM | POA: Diagnosis not present

## 2021-12-20 DIAGNOSIS — I10 Essential (primary) hypertension: Secondary | ICD-10-CM | POA: Diagnosis not present

## 2021-12-20 DIAGNOSIS — C9000 Multiple myeloma not having achieved remission: Secondary | ICD-10-CM | POA: Diagnosis not present

## 2021-12-20 DIAGNOSIS — D638 Anemia in other chronic diseases classified elsewhere: Secondary | ICD-10-CM | POA: Diagnosis not present

## 2021-12-26 DIAGNOSIS — C9000 Multiple myeloma not having achieved remission: Secondary | ICD-10-CM | POA: Diagnosis not present

## 2021-12-30 DIAGNOSIS — G250 Essential tremor: Secondary | ICD-10-CM | POA: Diagnosis not present

## 2021-12-30 DIAGNOSIS — I10 Essential (primary) hypertension: Secondary | ICD-10-CM | POA: Diagnosis not present

## 2021-12-31 DIAGNOSIS — Z5112 Encounter for antineoplastic immunotherapy: Secondary | ICD-10-CM | POA: Diagnosis not present

## 2021-12-31 DIAGNOSIS — Z5111 Encounter for antineoplastic chemotherapy: Secondary | ICD-10-CM | POA: Diagnosis not present

## 2021-12-31 DIAGNOSIS — C9000 Multiple myeloma not having achieved remission: Secondary | ICD-10-CM | POA: Diagnosis not present

## 2022-01-03 DIAGNOSIS — C9000 Multiple myeloma not having achieved remission: Secondary | ICD-10-CM | POA: Diagnosis not present

## 2022-01-03 DIAGNOSIS — Z5111 Encounter for antineoplastic chemotherapy: Secondary | ICD-10-CM | POA: Diagnosis not present

## 2022-01-07 DIAGNOSIS — Z5112 Encounter for antineoplastic immunotherapy: Secondary | ICD-10-CM | POA: Diagnosis not present

## 2022-01-07 DIAGNOSIS — Z5111 Encounter for antineoplastic chemotherapy: Secondary | ICD-10-CM | POA: Diagnosis not present

## 2022-01-07 DIAGNOSIS — C9000 Multiple myeloma not having achieved remission: Secondary | ICD-10-CM | POA: Diagnosis not present

## 2022-01-10 DIAGNOSIS — C9000 Multiple myeloma not having achieved remission: Secondary | ICD-10-CM | POA: Diagnosis not present

## 2022-01-10 DIAGNOSIS — D638 Anemia in other chronic diseases classified elsewhere: Secondary | ICD-10-CM | POA: Diagnosis not present

## 2022-01-11 DIAGNOSIS — C9000 Multiple myeloma not having achieved remission: Secondary | ICD-10-CM | POA: Diagnosis not present

## 2022-01-11 DIAGNOSIS — D638 Anemia in other chronic diseases classified elsewhere: Secondary | ICD-10-CM | POA: Diagnosis not present

## 2022-01-12 DIAGNOSIS — C9000 Multiple myeloma not having achieved remission: Secondary | ICD-10-CM | POA: Diagnosis not present

## 2022-01-12 DIAGNOSIS — D638 Anemia in other chronic diseases classified elsewhere: Secondary | ICD-10-CM | POA: Diagnosis not present

## 2022-01-14 DIAGNOSIS — Z5112 Encounter for antineoplastic immunotherapy: Secondary | ICD-10-CM | POA: Diagnosis not present

## 2022-01-14 DIAGNOSIS — C9000 Multiple myeloma not having achieved remission: Secondary | ICD-10-CM | POA: Diagnosis not present

## 2022-01-17 DIAGNOSIS — C9000 Multiple myeloma not having achieved remission: Secondary | ICD-10-CM | POA: Diagnosis not present

## 2022-01-21 DIAGNOSIS — C9000 Multiple myeloma not having achieved remission: Secondary | ICD-10-CM | POA: Diagnosis not present

## 2022-01-21 DIAGNOSIS — Z5111 Encounter for antineoplastic chemotherapy: Secondary | ICD-10-CM | POA: Diagnosis not present

## 2022-01-21 DIAGNOSIS — Z5112 Encounter for antineoplastic immunotherapy: Secondary | ICD-10-CM | POA: Diagnosis not present

## 2022-01-24 DIAGNOSIS — Z79899 Other long term (current) drug therapy: Secondary | ICD-10-CM | POA: Diagnosis not present

## 2022-01-24 DIAGNOSIS — C9000 Multiple myeloma not having achieved remission: Secondary | ICD-10-CM | POA: Diagnosis not present

## 2022-01-24 DIAGNOSIS — Z5111 Encounter for antineoplastic chemotherapy: Secondary | ICD-10-CM | POA: Diagnosis not present

## 2022-01-28 DIAGNOSIS — Z5112 Encounter for antineoplastic immunotherapy: Secondary | ICD-10-CM | POA: Diagnosis not present

## 2022-01-28 DIAGNOSIS — Z79899 Other long term (current) drug therapy: Secondary | ICD-10-CM | POA: Diagnosis not present

## 2022-01-28 DIAGNOSIS — Z5111 Encounter for antineoplastic chemotherapy: Secondary | ICD-10-CM | POA: Diagnosis not present

## 2022-01-28 DIAGNOSIS — C9000 Multiple myeloma not having achieved remission: Secondary | ICD-10-CM | POA: Diagnosis not present

## 2022-01-30 DIAGNOSIS — R69 Illness, unspecified: Secondary | ICD-10-CM | POA: Diagnosis not present

## 2022-01-30 DIAGNOSIS — I10 Essential (primary) hypertension: Secondary | ICD-10-CM | POA: Diagnosis not present

## 2022-01-30 DIAGNOSIS — F3342 Major depressive disorder, recurrent, in full remission: Secondary | ICD-10-CM | POA: Diagnosis not present

## 2022-01-30 DIAGNOSIS — I959 Hypotension, unspecified: Secondary | ICD-10-CM | POA: Diagnosis not present

## 2022-01-30 DIAGNOSIS — E785 Hyperlipidemia, unspecified: Secondary | ICD-10-CM | POA: Diagnosis not present

## 2022-01-30 DIAGNOSIS — D638 Anemia in other chronic diseases classified elsewhere: Secondary | ICD-10-CM | POA: Diagnosis not present

## 2022-01-31 DIAGNOSIS — C9000 Multiple myeloma not having achieved remission: Secondary | ICD-10-CM | POA: Diagnosis not present

## 2022-01-31 DIAGNOSIS — Z5112 Encounter for antineoplastic immunotherapy: Secondary | ICD-10-CM | POA: Diagnosis not present

## 2022-02-04 DIAGNOSIS — R69 Illness, unspecified: Secondary | ICD-10-CM | POA: Diagnosis not present

## 2022-02-04 DIAGNOSIS — F419 Anxiety disorder, unspecified: Secondary | ICD-10-CM | POA: Diagnosis not present

## 2022-02-04 DIAGNOSIS — I959 Hypotension, unspecified: Secondary | ICD-10-CM | POA: Diagnosis not present

## 2022-02-04 DIAGNOSIS — Z5112 Encounter for antineoplastic immunotherapy: Secondary | ICD-10-CM | POA: Diagnosis not present

## 2022-02-04 DIAGNOSIS — I10 Essential (primary) hypertension: Secondary | ICD-10-CM | POA: Diagnosis not present

## 2022-02-04 DIAGNOSIS — C9000 Multiple myeloma not having achieved remission: Secondary | ICD-10-CM | POA: Diagnosis not present

## 2022-02-10 DIAGNOSIS — C9000 Multiple myeloma not having achieved remission: Secondary | ICD-10-CM | POA: Diagnosis not present

## 2022-02-10 DIAGNOSIS — H6692 Otitis media, unspecified, left ear: Secondary | ICD-10-CM | POA: Diagnosis not present

## 2022-02-10 DIAGNOSIS — I10 Essential (primary) hypertension: Secondary | ICD-10-CM | POA: Diagnosis not present

## 2022-02-11 DIAGNOSIS — C9000 Multiple myeloma not having achieved remission: Secondary | ICD-10-CM | POA: Diagnosis not present

## 2022-02-11 DIAGNOSIS — D708 Other neutropenia: Secondary | ICD-10-CM | POA: Diagnosis not present

## 2022-02-11 DIAGNOSIS — Z5112 Encounter for antineoplastic immunotherapy: Secondary | ICD-10-CM | POA: Diagnosis not present

## 2022-02-11 DIAGNOSIS — D696 Thrombocytopenia, unspecified: Secondary | ICD-10-CM | POA: Diagnosis not present

## 2022-02-11 DIAGNOSIS — Z5111 Encounter for antineoplastic chemotherapy: Secondary | ICD-10-CM | POA: Diagnosis not present

## 2022-02-11 DIAGNOSIS — D638 Anemia in other chronic diseases classified elsewhere: Secondary | ICD-10-CM | POA: Diagnosis not present

## 2022-02-14 DIAGNOSIS — Z5111 Encounter for antineoplastic chemotherapy: Secondary | ICD-10-CM | POA: Diagnosis not present

## 2022-02-14 DIAGNOSIS — C9000 Multiple myeloma not having achieved remission: Secondary | ICD-10-CM | POA: Diagnosis not present

## 2022-02-17 DIAGNOSIS — C9000 Multiple myeloma not having achieved remission: Secondary | ICD-10-CM | POA: Diagnosis not present

## 2022-02-18 DIAGNOSIS — Z5111 Encounter for antineoplastic chemotherapy: Secondary | ICD-10-CM | POA: Diagnosis not present

## 2022-02-18 DIAGNOSIS — C9000 Multiple myeloma not having achieved remission: Secondary | ICD-10-CM | POA: Diagnosis not present

## 2022-02-18 DIAGNOSIS — Z5112 Encounter for antineoplastic immunotherapy: Secondary | ICD-10-CM | POA: Diagnosis not present

## 2022-02-21 DIAGNOSIS — Z5111 Encounter for antineoplastic chemotherapy: Secondary | ICD-10-CM | POA: Diagnosis not present

## 2022-02-21 DIAGNOSIS — C9000 Multiple myeloma not having achieved remission: Secondary | ICD-10-CM | POA: Diagnosis not present

## 2022-02-24 ENCOUNTER — Emergency Department (HOSPITAL_BASED_OUTPATIENT_CLINIC_OR_DEPARTMENT_OTHER)
Admission: EM | Admit: 2022-02-24 | Discharge: 2022-02-24 | Disposition: A | Discharge status: Home / Self Care | Payer: Medicare HMO | Attending: Emergency Medicine | Admitting: Emergency Medicine

## 2022-02-24 ENCOUNTER — Other Ambulatory Visit: Payer: Self-pay

## 2022-02-24 ENCOUNTER — Encounter (HOSPITAL_BASED_OUTPATIENT_CLINIC_OR_DEPARTMENT_OTHER): Payer: Self-pay | Admitting: Emergency Medicine

## 2022-02-24 ENCOUNTER — Emergency Department (HOSPITAL_BASED_OUTPATIENT_CLINIC_OR_DEPARTMENT_OTHER): Payer: Medicare HMO

## 2022-02-24 DIAGNOSIS — R42 Dizziness and giddiness: Secondary | ICD-10-CM | POA: Insufficient documentation

## 2022-02-24 DIAGNOSIS — I10 Essential (primary) hypertension: Secondary | ICD-10-CM | POA: Diagnosis not present

## 2022-02-24 DIAGNOSIS — R0602 Shortness of breath: Secondary | ICD-10-CM | POA: Insufficient documentation

## 2022-02-24 DIAGNOSIS — R55 Syncope and collapse: Secondary | ICD-10-CM | POA: Insufficient documentation

## 2022-02-24 HISTORY — DX: Malignant (primary) neoplasm, unspecified: C80.1

## 2022-02-24 HISTORY — DX: Multiple myeloma not having achieved remission: C90.00

## 2022-02-24 LAB — CBC
HCT: 29.7 % — ABNORMAL LOW (ref 36.0–46.0)
Hemoglobin: 10.1 g/dL — ABNORMAL LOW (ref 12.0–15.0)
MCH: 36.5 pg — ABNORMAL HIGH (ref 26.0–34.0)
MCHC: 34 g/dL (ref 30.0–36.0)
MCV: 107.2 fL — ABNORMAL HIGH (ref 80.0–100.0)
Platelets: 107 10*3/uL — ABNORMAL LOW (ref 150–400)
RBC: 2.77 MIL/uL — ABNORMAL LOW (ref 3.87–5.11)
RDW: 13.9 % (ref 11.5–15.5)
WBC: 5.8 10*3/uL (ref 4.0–10.5)
nRBC: 0.9 % — ABNORMAL HIGH (ref 0.0–0.2)

## 2022-02-24 LAB — BASIC METABOLIC PANEL
Anion gap: 8 (ref 5–15)
BUN: 10 mg/dL (ref 8–23)
CO2: 23 mmol/L (ref 22–32)
Calcium: 8.8 mg/dL — ABNORMAL LOW (ref 8.9–10.3)
Chloride: 105 mmol/L (ref 98–111)
Creatinine, Ser: 0.93 mg/dL (ref 0.44–1.00)
GFR, Estimated: >60 mL/min (ref >60)
Glucose, Bld: 104 mg/dL — ABNORMAL HIGH (ref 70–99)
Potassium: 3.6 mmol/L (ref 3.5–5.1)
Sodium: 136 mmol/L (ref 135–145)

## 2022-02-24 LAB — CBG MONITORING, ED: Glucose-Capillary: 108 mg/dL — ABNORMAL HIGH (ref 70–99)

## 2022-02-24 MED ORDER — IOHEXOL 350 MG/ML SOLN
75.0000 mL | Freq: Once | INTRAVENOUS | Status: AC | PRN
  Administered 2022-02-24: 75 mL via INTRAVENOUS

## 2022-02-24 MED ORDER — SODIUM CHLORIDE 0.9 % IV BOLUS
1000.0000 mL | Freq: Once | INTRAVENOUS | Status: AC
  Administered 2022-02-24: 1000 mL via INTRAVENOUS

## 2022-02-24 NOTE — ED Triage Notes (Signed)
Pt reports intermittent episodes of hypotension and "shakey" feeling. Pt reports syncopal episode with hypotension that occurred today after getting out of the shower. Pt taken off BP meds 2 weeks ago due to this. No associated activity with syncopal episodes. Reports PCP is already following pt regarding her sx. Reports syncopal episodes daily. Currently on chemo for multiple myeloma. Endorses minimal po intake while on chemo. Denies injury from fall.

## 2022-02-24 NOTE — ED Notes (Signed)
Pt aware of the need for a urine sample. 

## 2022-02-24 NOTE — ED Provider Notes (Signed)
[facility] Provider Note   CSN: [id number] Arrival date & time: 02/24/22  1359     History  Chief Complaint  Patient presents with   Loss of Consciousness    Anna Castillo is a 66 y.o. female with medical history of multiple myeloma currently on chemotherapy, dizziness, heart rotations, hypertension, lightheadedness, shortness of breath.  Patient presents to ED for evaluation of syncope, shortness of breath.  Patient reports that for the last 1 to 2 months that she has had intermittent episodes of lightheadedness, dizziness and the occasional syncopal event.  Patient reports that this is currently being followed by her PCP.  Patient reports that initially there was thought given to the fact that this might be due to her blood pressure medication.  Patient was taken off of high blood pressure medication 1 month ago however states that she is still having syncopal events.  Patient reports that today after getting out of the shower she became very weak, lightheaded and sat down.  Patient reports that she then tried to stand and go to her [place] however could only get halfway there and then had to lower herself down to the ground.  Patient reports that she syncopized at this time for 1 minute.  Patient denies any fevers, nausea, vomiting, one-sided weakness or numbness, chest pain.  Patient does endorse shortness of breath.   Loss of Consciousness Associated symptoms: dizziness and shortness of breath   Associated symptoms: no chest pain, no fever, no nausea, no vomiting and no weakness        Home Medications Prior to Admission medications   Medication Sig Start Date End Date Taking? Authorizing Provider  ALPRAZolam Duanne Moron) 0.25 MG tablet Take 0.25 mg by mouth at bedtime as needed for sleep.    [provider]  atorvastatin (LIPITOR) 10 MG tablet Take 10 mg by mouth daily. 01/13/18   [provider]  buPROPion (WELLBUTRIN SR) 150 MG 12 hr  tablet Take 150 mg by mouth daily. 01/13/18   [provider]  estradiol (ESTRACE) 0.5 MG tablet Take 0.5 mg by mouth daily.    [provider]  LINZESS 145 MCG CAPS capsule Take 145 mcg by mouth every evening. 03/10/18   [provider]  meclizine (ANTIVERT) 25 MG tablet Take 1 tablet (25 mg total) by mouth 3 (three) times daily as needed for dizziness. 03/25/18   Valarie Merino, MD  omeprazole (PRILOSEC OTC) 20 MG tablet Take 20 mg by mouth daily.    [provider]  ondansetron (ZOFRAN) 4 MG tablet Take 1 tablet (4 mg total) by mouth every 6 (six) hours. 03/25/18   Valarie Merino, MD  phentermine (ADIPEX-P) 37.5 MG tablet Take 37.5 mg by mouth daily before breakfast.    [provider]  vitamin E (VITAMIN E) 400 UNIT capsule Take 400 Units by mouth daily.    [provider]      Allergies    Hydrocodone, Other, Penicillins, and Penicillamine    Review of Systems   Review of Systems  Constitutional:  Negative for fever.  Respiratory:  Positive for shortness of breath.   Cardiovascular:  Positive for syncope. Negative for chest pain.  Gastrointestinal:  Negative for diarrhea, nausea and vomiting.  Neurological:  Positive for dizziness, syncope and light-headedness. Negative for weakness and numbness.  All other systems reviewed and are negative.   Physical Exam Updated Vital Signs BP 108/71 (BP Location: Left Arm)   Pulse 99  Temp 97.9 F (36.6 C) (Oral)   Resp 16   Wt 90.7 kg   SpO2 99%   BMI 34.33 kg/m  Physical Exam Vitals and nursing note reviewed.  Constitutional:      General: She is not in acute distress.    Appearance: Normal appearance. She is not ill-appearing, toxic-appearing or diaphoretic.  HENT:     Head: Normocephalic and atraumatic.     Nose: Nose normal. No congestion.     Mouth/Throat:     Mouth: Mucous membranes are moist.     Pharynx: Oropharynx is clear.  Eyes:     Extraocular Movements:  Extraocular movements intact.     Conjunctiva/sclera: Conjunctivae normal.     Pupils: Pupils are equal, round, and reactive to light.  Cardiovascular:     Rate and Rhythm: Normal rate and regular rhythm.  Pulmonary:     Effort: Pulmonary effort is normal.     Breath sounds: Normal breath sounds. No wheezing.  Abdominal:     General: Abdomen is flat. Bowel sounds are normal.     Palpations: Abdomen is soft.     Tenderness: There is no abdominal tenderness.  Musculoskeletal:     Cervical back: Normal range of motion and neck supple. No tenderness.  Skin:    General: Skin is warm and dry.     Capillary Refill: Capillary refill takes less than 2 seconds.  Neurological:     General: No focal deficit present.     Mental Status: She is alert and oriented to person, place, and time.     GCS: GCS eye subscore is 4. GCS verbal subscore is 5. GCS motor subscore is 6.     Cranial Nerves: Cranial nerves 2-12 are intact. No cranial nerve deficit.     Sensory: Sensation is intact. No sensory deficit.     Motor: Motor function is intact. No weakness.     Coordination: Coordination is intact. Heel to Shin Test normal.     ED Results / Procedures / Treatments   Labs (all labs ordered are listed, but only abnormal results are displayed) Labs Reviewed  BASIC METABOLIC PANEL - Abnormal; Notable for the following components:      Result Value   Glucose, Bld 104 (*)    Calcium 8.8 (*)    All other components within normal limits  CBC - Abnormal; Notable for the following components:   RBC 2.77 (*)    Hemoglobin 10.1 (*)    HCT 29.7 (*)    MCV 107.2 (*)    MCH 36.5 (*)    Platelets 107 (*)    nRBC 0.9 (*)    All other components within normal limits  CBG MONITORING, ED - Abnormal; Notable for the following components:   Glucose-Capillary 108 (*)    All other components within normal limits  URINALYSIS, ROUTINE W REFLEX MICROSCOPIC    EKG EKG Interpretation  Date/Time:  Monday February 24 2022 14:08:29 EST Ventricular Rate:  114 PR Interval:  134 QRS Duration: 76 QT Interval:  352 QTC Calculation: 485 R Axis:   78 Text Interpretation: Sinus tachycardia Low voltage QRS Borderline ECG When compared with ECG of 25-Mar-2018 09:56, PREVIOUS ECG IS PRESENT when compared to prior, similar appearance. No STEMI Confirmed by Antony Blackbird [phone]) on 02/24/2022 4:34:23 PM  Radiology CT Angio Chest PE W and/or Wo Contrast  Result Date: 02/24/2022 CLINICAL DATA:  Pulmonary embolism (PE) suspected, unknown D-dimer Patient reports syncopal episode after getting out of  the shower. Shortness of breath for several months. EXAM: CT ANGIOGRAPHY CHEST WITH CONTRAST TECHNIQUE: Multidetector CT imaging of the chest was performed using the standard protocol during bolus administration of intravenous contrast. Multiplanar CT image reconstructions and MIPs were obtained to evaluate the vascular anatomy. RADIATION DOSE REDUCTION: This exam was performed according to the departmental dose-optimization program which includes automated exposure control, adjustment of the mA and/or kV according to patient size and/or use of iterative reconstruction technique. CONTRAST:  64m OMNIPAQUE IOHEXOL 350 MG/ML SOLN COMPARISON:  Remote chest radiograph 06/11/2010, no recent or interval chest imaging available FINDINGS: Cardiovascular: There are no filling defects within the pulmonary arteries to suggest pulmonary embolus. The heart is normal in size. There is a small circumferential pericardial effusion. There are coronary artery calcifications. Moderate atherosclerosis of the thoracic aorta. No acute aortic findings or aortic aneurysm. Mediastinum/Nodes: No enlarged mediastinal or hilar lymph nodes. No visible thyroid nodule. Esophagus is decompressed. Lungs/Pleura: Areas of subpleural ground-glass in the basilar predominant distribution. No confluent consolidation. There is mild central bronchial thickening. Punctate left  upper lobe nodule is appreciated on coronal reformat series 7, image 55. No pulmonary mass. No pleural fluid. Upper Abdomen: Gastric sleeve resection. No acute upper abdominal findings. Mild thoracic spondylosis. Musculoskeletal: There are no acute or suspicious osseous abnormalities. Review of the MIP images confirms the above findings. IMPRESSION: 1. No pulmonary embolus. 2. Areas of subpleural ground-glass in the basilar predominant distribution. This may be post infectious or inflammatory. Possibility of interstitial lung disease is considered in the setting of months lung shortness of breath. Consider follow-up high-resolution chest CT in 3-6 months to further assess. 3. Mild central bronchial thickening. 4. Small circumferential pericardial effusion. 5. Aortic atherosclerosis.  Coronary artery calcifications. 6. Punctate left upper lobe pulmonary nodule. Per Fleischner Society Guidelines, if patient is low risk for malignancy, no routine follow-up imaging is recommended. If patient is high risk for malignancy, a non-contrast Chest CT at 12 months is optional. If performed and the nodule is stable at 12 months, no further follow-up is recommended. These guidelines do not apply to immunocompromised patients and patients with cancer. Follow up in patients with significant comorbidities as clinically warranted. For lung cancer screening, adhere to Lung-RADS guidelines. Reference: Radiology. 2017; 284(1):228-43. Aortic Atherosclerosis (ICD10-I70.0). Electronically Signed   By: MKeith RakeM.D.   On: 02/24/2022 16:14    Procedures Procedures   Medications Ordered in ED Medications  sodium chloride 0.9 % bolus 1,000 mL (0 mLs Intravenous Stopped 02/24/22 1628)  iohexol (OMNIPAQUE) 350 MG/ML injection 75 mL (75 mLs Intravenous Contrast Given 02/24/22 1543)    ED Course/ Medical Decision Making/ A&P                           Medical Decision Making Amount and/or Complexity of Data Reviewed Labs:  ordered. Radiology: ordered.   66year old female presents to ED for evaluation.  Please see HPI for further details.  On evaluation the patient is afebrile however she is tachycardic to 118.  The patient lung sounds are clear bilaterally, she is not hypoxic.  Patient abdomen is soft and compressible throughout.  Patient neurological examination shows no focal neurodeficits.  Patient work-up will include the following imaging lab studies to include CBC, BMP, CBG, CT angio chest, EKG.  Patient treated with 1 L of fluid initially for tachycardia.  Patient CBC unremarkable besides hemoglobin of 10.1.  The patient denies any darker stools, blood in vomit,  fluid loss or blood loss of any kind.  The patient reports her hemoglobin has been much lower than 10 before, states that hemoglobin of 10.1 is "great".  Patient BMP unremarkable.  Patient CBG 108.  Patient CT angio shows no acute PE however there are findings of areas of subpleural groundglass in the basilar predominant distribution to the lungs which could be possibly postinfectious or inflammatory.  Patient states that she has had these imaging studies before on chest x-rays, CT angiograms.  The patient states that she is aware of it.  The patient was encouraged to follow-up on this result with her PCP this week.  History decision-making conversation was had with the patient.  I advised the patient that her pulse rate has normalized, she has [place] tachycardic.  The patient reports that she feels much better than she did when she came in.  The patient is requesting discharge at this time.  I explained the patient that her work-up was unremarkable, me and my attending do feel comfortable sending her home.  The patient voiced agreement with this.   I advised the patient to follow-up with her PCP this week concerning her imaging studies of her chest as well as to continue investigating cause of syncope.  Patient voiced understanding of my instructions.   Patient had all of her questions answered to her satisfaction.  The patient stable to discharge.  Final Clinical Impression(s) / ED Diagnoses Final diagnoses:  Syncope and collapse    Rx / DC Orders ED Discharge Orders     None         Azucena Cecil, PA-C 02/24/22 [place], [place], DO 02/25/22 [phone]

## 2022-02-24 NOTE — ED Notes (Signed)
Patient transported to CT 

## 2022-02-24 NOTE — Discharge Instructions (Signed)
Return to the ED with any new or worsening signs or symptoms Follow-up with your PCP this week concerning imaging studies of chest Please read the attached guide concerning syncope Please continue pushing fluids at home

## 2022-02-25 DIAGNOSIS — Z79899 Other long term (current) drug therapy: Secondary | ICD-10-CM | POA: Diagnosis not present

## 2022-02-25 DIAGNOSIS — C9000 Multiple myeloma not having achieved remission: Secondary | ICD-10-CM | POA: Diagnosis not present

## 2022-02-25 DIAGNOSIS — Z5112 Encounter for antineoplastic immunotherapy: Secondary | ICD-10-CM | POA: Diagnosis not present

## 2022-02-25 DIAGNOSIS — R55 Syncope and collapse: Secondary | ICD-10-CM | POA: Diagnosis not present

## 2022-02-25 DIAGNOSIS — R0602 Shortness of breath: Secondary | ICD-10-CM | POA: Diagnosis not present

## 2022-02-27 DIAGNOSIS — D539 Nutritional anemia, unspecified: Secondary | ICD-10-CM | POA: Diagnosis not present

## 2022-02-27 DIAGNOSIS — C9000 Multiple myeloma not having achieved remission: Secondary | ICD-10-CM | POA: Diagnosis not present

## 2022-02-27 DIAGNOSIS — E782 Mixed hyperlipidemia: Secondary | ICD-10-CM | POA: Diagnosis not present

## 2022-02-27 DIAGNOSIS — D61818 Other pancytopenia: Secondary | ICD-10-CM | POA: Diagnosis not present

## 2022-02-27 DIAGNOSIS — I10 Essential (primary) hypertension: Secondary | ICD-10-CM | POA: Diagnosis not present

## 2022-02-27 DIAGNOSIS — R55 Syncope and collapse: Secondary | ICD-10-CM | POA: Diagnosis not present

## 2022-03-03 DIAGNOSIS — Z7682 Awaiting organ transplant status: Secondary | ICD-10-CM | POA: Diagnosis not present

## 2022-03-03 DIAGNOSIS — C9000 Multiple myeloma not having achieved remission: Secondary | ICD-10-CM | POA: Diagnosis not present

## 2022-03-03 DIAGNOSIS — Z79899 Other long term (current) drug therapy: Secondary | ICD-10-CM | POA: Diagnosis not present

## 2022-03-03 DIAGNOSIS — I3139 Other pericardial effusion (noninflammatory): Secondary | ICD-10-CM | POA: Diagnosis not present

## 2022-03-03 DIAGNOSIS — I517 Cardiomegaly: Secondary | ICD-10-CM | POA: Diagnosis not present

## 2022-03-03 DIAGNOSIS — R000 Tachycardia, unspecified: Secondary | ICD-10-CM | POA: Diagnosis not present

## 2022-03-03 DIAGNOSIS — I10 Essential (primary) hypertension: Secondary | ICD-10-CM | POA: Diagnosis not present

## 2022-03-03 DIAGNOSIS — I071 Rheumatic tricuspid insufficiency: Secondary | ICD-10-CM | POA: Diagnosis not present

## 2022-03-03 DIAGNOSIS — D696 Thrombocytopenia, unspecified: Secondary | ICD-10-CM | POA: Diagnosis not present

## 2022-03-03 DIAGNOSIS — D638 Anemia in other chronic diseases classified elsewhere: Secondary | ICD-10-CM | POA: Diagnosis not present

## 2022-03-03 DIAGNOSIS — G629 Polyneuropathy, unspecified: Secondary | ICD-10-CM | POA: Diagnosis not present

## 2022-03-05 DIAGNOSIS — D630 Anemia in neoplastic disease: Secondary | ICD-10-CM | POA: Diagnosis not present

## 2022-03-05 DIAGNOSIS — I517 Cardiomegaly: Secondary | ICD-10-CM | POA: Diagnosis not present

## 2022-03-05 DIAGNOSIS — D696 Thrombocytopenia, unspecified: Secondary | ICD-10-CM | POA: Diagnosis not present

## 2022-03-05 DIAGNOSIS — C9000 Multiple myeloma not having achieved remission: Secondary | ICD-10-CM | POA: Diagnosis not present

## 2022-03-05 DIAGNOSIS — I3139 Other pericardial effusion (noninflammatory): Secondary | ICD-10-CM | POA: Diagnosis not present

## 2022-03-05 DIAGNOSIS — K7689 Other specified diseases of liver: Secondary | ICD-10-CM | POA: Diagnosis not present

## 2022-03-05 DIAGNOSIS — D638 Anemia in other chronic diseases classified elsewhere: Secondary | ICD-10-CM | POA: Diagnosis not present

## 2022-03-05 DIAGNOSIS — D708 Other neutropenia: Secondary | ICD-10-CM | POA: Diagnosis not present

## 2022-03-05 DIAGNOSIS — N289 Disorder of kidney and ureter, unspecified: Secondary | ICD-10-CM | POA: Diagnosis not present

## 2022-03-05 DIAGNOSIS — I10 Essential (primary) hypertension: Secondary | ICD-10-CM | POA: Diagnosis not present

## 2022-03-05 DIAGNOSIS — Z7682 Awaiting organ transplant status: Secondary | ICD-10-CM | POA: Diagnosis not present

## 2022-03-10 DIAGNOSIS — I10 Essential (primary) hypertension: Secondary | ICD-10-CM | POA: Diagnosis not present

## 2022-03-10 DIAGNOSIS — I951 Orthostatic hypotension: Secondary | ICD-10-CM | POA: Diagnosis not present

## 2022-03-10 DIAGNOSIS — G629 Polyneuropathy, unspecified: Secondary | ICD-10-CM | POA: Diagnosis not present

## 2022-03-11 DIAGNOSIS — D696 Thrombocytopenia, unspecified: Secondary | ICD-10-CM | POA: Diagnosis not present

## 2022-03-11 DIAGNOSIS — D708 Other neutropenia: Secondary | ICD-10-CM | POA: Diagnosis not present

## 2022-03-11 DIAGNOSIS — C9000 Multiple myeloma not having achieved remission: Secondary | ICD-10-CM | POA: Diagnosis not present

## 2022-03-11 DIAGNOSIS — D638 Anemia in other chronic diseases classified elsewhere: Secondary | ICD-10-CM | POA: Diagnosis not present

## 2022-03-17 DIAGNOSIS — M961 Postlaminectomy syndrome, not elsewhere classified: Secondary | ICD-10-CM | POA: Diagnosis not present

## 2022-03-17 DIAGNOSIS — M5416 Radiculopathy, lumbar region: Secondary | ICD-10-CM | POA: Diagnosis not present

## 2022-03-19 DIAGNOSIS — I10 Essential (primary) hypertension: Secondary | ICD-10-CM | POA: Diagnosis not present

## 2022-03-19 DIAGNOSIS — F419 Anxiety disorder, unspecified: Secondary | ICD-10-CM | POA: Diagnosis not present

## 2022-03-19 DIAGNOSIS — D638 Anemia in other chronic diseases classified elsewhere: Secondary | ICD-10-CM | POA: Diagnosis not present

## 2022-03-19 DIAGNOSIS — Z7682 Awaiting organ transplant status: Secondary | ICD-10-CM | POA: Diagnosis not present

## 2022-03-19 DIAGNOSIS — R69 Illness, unspecified: Secondary | ICD-10-CM | POA: Diagnosis not present

## 2022-03-19 DIAGNOSIS — C9000 Multiple myeloma not having achieved remission: Secondary | ICD-10-CM | POA: Diagnosis not present

## 2022-03-19 DIAGNOSIS — I959 Hypotension, unspecified: Secondary | ICD-10-CM | POA: Diagnosis not present

## 2022-03-19 DIAGNOSIS — R000 Tachycardia, unspecified: Secondary | ICD-10-CM | POA: Diagnosis not present

## 2022-03-21 DIAGNOSIS — Z452 Encounter for adjustment and management of vascular access device: Secondary | ICD-10-CM | POA: Diagnosis not present

## 2022-03-21 DIAGNOSIS — I951 Orthostatic hypotension: Secondary | ICD-10-CM | POA: Diagnosis not present

## 2022-03-21 DIAGNOSIS — C9000 Multiple myeloma not having achieved remission: Secondary | ICD-10-CM | POA: Diagnosis not present

## 2022-03-21 DIAGNOSIS — Z7982 Long term (current) use of aspirin: Secondary | ICD-10-CM | POA: Diagnosis not present

## 2022-03-21 DIAGNOSIS — Z5181 Encounter for therapeutic drug level monitoring: Secondary | ICD-10-CM | POA: Diagnosis not present

## 2022-03-21 DIAGNOSIS — Z79899 Other long term (current) drug therapy: Secondary | ICD-10-CM | POA: Diagnosis not present

## 2022-03-21 DIAGNOSIS — Z7682 Awaiting organ transplant status: Secondary | ICD-10-CM | POA: Diagnosis not present

## 2022-03-23 DIAGNOSIS — Z5112 Encounter for antineoplastic immunotherapy: Secondary | ICD-10-CM | POA: Diagnosis not present

## 2022-03-23 DIAGNOSIS — C9000 Multiple myeloma not having achieved remission: Secondary | ICD-10-CM | POA: Diagnosis not present

## 2022-03-24 DIAGNOSIS — Z5181 Encounter for therapeutic drug level monitoring: Secondary | ICD-10-CM | POA: Diagnosis not present

## 2022-03-24 DIAGNOSIS — Z7982 Long term (current) use of aspirin: Secondary | ICD-10-CM | POA: Diagnosis not present

## 2022-03-24 DIAGNOSIS — C9000 Multiple myeloma not having achieved remission: Secondary | ICD-10-CM | POA: Diagnosis not present

## 2022-03-24 DIAGNOSIS — Z7682 Awaiting organ transplant status: Secondary | ICD-10-CM | POA: Diagnosis not present

## 2022-03-24 DIAGNOSIS — I951 Orthostatic hypotension: Secondary | ICD-10-CM | POA: Diagnosis not present

## 2022-03-24 DIAGNOSIS — Z79899 Other long term (current) drug therapy: Secondary | ICD-10-CM | POA: Diagnosis not present

## 2022-03-25 DIAGNOSIS — C9000 Multiple myeloma not having achieved remission: Secondary | ICD-10-CM | POA: Diagnosis not present

## 2022-03-25 DIAGNOSIS — Z5112 Encounter for antineoplastic immunotherapy: Secondary | ICD-10-CM | POA: Diagnosis not present

## 2022-03-26 DIAGNOSIS — Z7682 Awaiting organ transplant status: Secondary | ICD-10-CM | POA: Diagnosis not present

## 2022-03-26 DIAGNOSIS — C9000 Multiple myeloma not having achieved remission: Secondary | ICD-10-CM | POA: Diagnosis not present

## 2022-03-26 DIAGNOSIS — I10 Essential (primary) hypertension: Secondary | ICD-10-CM | POA: Diagnosis not present

## 2022-03-27 DIAGNOSIS — C9000 Multiple myeloma not having achieved remission: Secondary | ICD-10-CM | POA: Diagnosis not present

## 2022-03-27 DIAGNOSIS — Z5112 Encounter for antineoplastic immunotherapy: Secondary | ICD-10-CM | POA: Diagnosis not present

## 2022-03-27 DIAGNOSIS — Z7682 Awaiting organ transplant status: Secondary | ICD-10-CM | POA: Diagnosis not present

## 2022-03-27 DIAGNOSIS — I10 Essential (primary) hypertension: Secondary | ICD-10-CM | POA: Diagnosis not present

## 2022-04-01 DIAGNOSIS — Z79899 Other long term (current) drug therapy: Secondary | ICD-10-CM | POA: Diagnosis not present

## 2022-04-01 DIAGNOSIS — Z7682 Awaiting organ transplant status: Secondary | ICD-10-CM | POA: Diagnosis not present

## 2022-04-01 DIAGNOSIS — C9000 Multiple myeloma not having achieved remission: Secondary | ICD-10-CM | POA: Diagnosis not present

## 2022-04-01 DIAGNOSIS — Z1152 Encounter for screening for COVID-19: Secondary | ICD-10-CM | POA: Diagnosis not present

## 2022-04-02 DIAGNOSIS — D84821 Immunodeficiency due to drugs: Secondary | ICD-10-CM | POA: Diagnosis not present

## 2022-04-02 DIAGNOSIS — K7689 Other specified diseases of liver: Secondary | ICD-10-CM | POA: Diagnosis not present

## 2022-04-02 DIAGNOSIS — R69 Illness, unspecified: Secondary | ICD-10-CM | POA: Diagnosis not present

## 2022-04-02 DIAGNOSIS — G909 Disorder of the autonomic nervous system, unspecified: Secondary | ICD-10-CM | POA: Diagnosis not present

## 2022-04-02 DIAGNOSIS — R509 Fever, unspecified: Secondary | ICD-10-CM | POA: Diagnosis not present

## 2022-04-02 DIAGNOSIS — S0990XA Unspecified injury of head, initial encounter: Secondary | ICD-10-CM | POA: Diagnosis not present

## 2022-04-02 DIAGNOSIS — R110 Nausea: Secondary | ICD-10-CM | POA: Diagnosis not present

## 2022-04-02 DIAGNOSIS — D8481 Immunodeficiency due to conditions classified elsewhere: Secondary | ICD-10-CM | POA: Diagnosis not present

## 2022-04-02 DIAGNOSIS — W06XXXA Fall from bed, initial encounter: Secondary | ICD-10-CM | POA: Diagnosis not present

## 2022-04-02 DIAGNOSIS — D630 Anemia in neoplastic disease: Secondary | ICD-10-CM | POA: Diagnosis not present

## 2022-04-02 DIAGNOSIS — I951 Orthostatic hypotension: Secondary | ICD-10-CM | POA: Diagnosis not present

## 2022-04-02 DIAGNOSIS — I611 Nontraumatic intracerebral hemorrhage in hemisphere, cortical: Secondary | ICD-10-CM | POA: Diagnosis not present

## 2022-04-02 DIAGNOSIS — I10 Essential (primary) hypertension: Secondary | ICD-10-CM | POA: Diagnosis not present

## 2022-04-02 DIAGNOSIS — I959 Hypotension, unspecified: Secondary | ICD-10-CM | POA: Diagnosis not present

## 2022-04-02 DIAGNOSIS — Z452 Encounter for adjustment and management of vascular access device: Secondary | ICD-10-CM | POA: Diagnosis not present

## 2022-04-02 DIAGNOSIS — Z90710 Acquired absence of both cervix and uterus: Secondary | ICD-10-CM | POA: Diagnosis not present

## 2022-04-02 DIAGNOSIS — Z9484 Stem cells transplant status: Secondary | ICD-10-CM | POA: Diagnosis not present

## 2022-04-02 DIAGNOSIS — N281 Cyst of kidney, acquired: Secondary | ICD-10-CM | POA: Diagnosis not present

## 2022-04-02 DIAGNOSIS — J90 Pleural effusion, not elsewhere classified: Secondary | ICD-10-CM | POA: Diagnosis not present

## 2022-04-02 DIAGNOSIS — D6959 Other secondary thrombocytopenia: Secondary | ICD-10-CM | POA: Diagnosis not present

## 2022-04-02 DIAGNOSIS — R112 Nausea with vomiting, unspecified: Secondary | ICD-10-CM | POA: Diagnosis not present

## 2022-04-02 DIAGNOSIS — Z7682 Awaiting organ transplant status: Secondary | ICD-10-CM | POA: Diagnosis not present

## 2022-04-02 DIAGNOSIS — D6869 Other thrombophilia: Secondary | ICD-10-CM | POA: Diagnosis not present

## 2022-04-02 DIAGNOSIS — D61810 Antineoplastic chemotherapy induced pancytopenia: Secondary | ICD-10-CM | POA: Diagnosis not present

## 2022-04-02 DIAGNOSIS — Z6832 Body mass index (BMI) 32.0-32.9, adult: Secondary | ICD-10-CM | POA: Diagnosis not present

## 2022-04-02 DIAGNOSIS — Y848 Other medical procedures as the cause of abnormal reaction of the patient, or of later complication, without mention of misadventure at the time of the procedure: Secondary | ICD-10-CM | POA: Diagnosis not present

## 2022-04-02 DIAGNOSIS — E860 Dehydration: Secondary | ICD-10-CM | POA: Diagnosis not present

## 2022-04-02 DIAGNOSIS — E669 Obesity, unspecified: Secondary | ICD-10-CM | POA: Diagnosis not present

## 2022-04-02 DIAGNOSIS — R918 Other nonspecific abnormal finding of lung field: Secondary | ICD-10-CM | POA: Diagnosis not present

## 2022-04-02 DIAGNOSIS — I3139 Other pericardial effusion (noninflammatory): Secondary | ICD-10-CM | POA: Diagnosis not present

## 2022-04-02 DIAGNOSIS — G9340 Encephalopathy, unspecified: Secondary | ICD-10-CM | POA: Diagnosis not present

## 2022-04-02 DIAGNOSIS — F331 Major depressive disorder, recurrent, moderate: Secondary | ICD-10-CM | POA: Diagnosis not present

## 2022-04-02 DIAGNOSIS — G8929 Other chronic pain: Secondary | ICD-10-CM | POA: Diagnosis not present

## 2022-04-02 DIAGNOSIS — T865 Complications of stem cell transplant: Secondary | ICD-10-CM | POA: Diagnosis not present

## 2022-04-02 DIAGNOSIS — I700 Atherosclerosis of aorta: Secondary | ICD-10-CM | POA: Diagnosis not present

## 2022-04-02 DIAGNOSIS — T451X5A Adverse effect of antineoplastic and immunosuppressive drugs, initial encounter: Secondary | ICD-10-CM | POA: Diagnosis not present

## 2022-04-02 DIAGNOSIS — R0989 Other specified symptoms and signs involving the circulatory and respiratory systems: Secondary | ICD-10-CM | POA: Diagnosis not present

## 2022-04-02 DIAGNOSIS — I517 Cardiomegaly: Secondary | ICD-10-CM | POA: Diagnosis not present

## 2022-04-02 DIAGNOSIS — R531 Weakness: Secondary | ICD-10-CM | POA: Diagnosis not present

## 2022-04-02 DIAGNOSIS — M79604 Pain in right leg: Secondary | ICD-10-CM | POA: Diagnosis not present

## 2022-04-02 DIAGNOSIS — T50905A Adverse effect of unspecified drugs, medicaments and biological substances, initial encounter: Secondary | ICD-10-CM | POA: Diagnosis not present

## 2022-04-02 DIAGNOSIS — G250 Essential tremor: Secondary | ICD-10-CM | POA: Diagnosis not present

## 2022-04-02 DIAGNOSIS — S066X9A Traumatic subarachnoid hemorrhage with loss of consciousness of unspecified duration, initial encounter: Secondary | ICD-10-CM | POA: Diagnosis not present

## 2022-04-02 DIAGNOSIS — R627 Adult failure to thrive: Secondary | ICD-10-CM | POA: Diagnosis not present

## 2022-04-02 DIAGNOSIS — K3184 Gastroparesis: Secondary | ICD-10-CM | POA: Diagnosis not present

## 2022-04-02 DIAGNOSIS — Y92230 Patient room in hospital as the place of occurrence of the external cause: Secondary | ICD-10-CM | POA: Diagnosis not present

## 2022-04-02 DIAGNOSIS — K219 Gastro-esophageal reflux disease without esophagitis: Secondary | ICD-10-CM | POA: Diagnosis not present

## 2022-04-02 DIAGNOSIS — D84822 Immunodeficiency due to external causes: Secondary | ICD-10-CM | POA: Diagnosis not present

## 2022-04-02 DIAGNOSIS — D638 Anemia in other chronic diseases classified elsewhere: Secondary | ICD-10-CM | POA: Diagnosis not present

## 2022-04-02 DIAGNOSIS — E222 Syndrome of inappropriate secretion of antidiuretic hormone: Secondary | ICD-10-CM | POA: Diagnosis not present

## 2022-04-02 DIAGNOSIS — K209 Esophagitis, unspecified without bleeding: Secondary | ICD-10-CM | POA: Diagnosis not present

## 2022-04-02 DIAGNOSIS — C9000 Multiple myeloma not having achieved remission: Secondary | ICD-10-CM | POA: Diagnosis not present

## 2022-04-02 DIAGNOSIS — Z9049 Acquired absence of other specified parts of digestive tract: Secondary | ICD-10-CM | POA: Diagnosis not present

## 2022-04-02 DIAGNOSIS — R296 Repeated falls: Secondary | ICD-10-CM | POA: Diagnosis not present

## 2022-04-02 DIAGNOSIS — J810 Acute pulmonary edema: Secondary | ICD-10-CM | POA: Diagnosis not present

## 2022-04-02 DIAGNOSIS — D6481 Anemia due to antineoplastic chemotherapy: Secondary | ICD-10-CM | POA: Diagnosis not present

## 2022-04-02 DIAGNOSIS — F329 Major depressive disorder, single episode, unspecified: Secondary | ICD-10-CM | POA: Diagnosis not present

## 2022-04-02 DIAGNOSIS — R000 Tachycardia, unspecified: Secondary | ICD-10-CM | POA: Diagnosis not present

## 2022-04-02 DIAGNOSIS — I708 Atherosclerosis of other arteries: Secondary | ICD-10-CM | POA: Diagnosis not present

## 2022-04-02 DIAGNOSIS — I2510 Atherosclerotic heart disease of native coronary artery without angina pectoris: Secondary | ICD-10-CM | POA: Diagnosis not present

## 2022-04-02 DIAGNOSIS — E876 Hypokalemia: Secondary | ICD-10-CM | POA: Diagnosis not present

## 2022-04-02 DIAGNOSIS — R530 Neoplastic (malignant) related fatigue: Secondary | ICD-10-CM | POA: Diagnosis not present

## 2022-04-02 DIAGNOSIS — K2289 Other specified disease of esophagus: Secondary | ICD-10-CM | POA: Diagnosis not present

## 2022-04-03 DIAGNOSIS — R112 Nausea with vomiting, unspecified: Secondary | ICD-10-CM | POA: Diagnosis not present

## 2022-04-03 DIAGNOSIS — C9000 Multiple myeloma not having achieved remission: Secondary | ICD-10-CM | POA: Diagnosis not present

## 2022-04-04 DIAGNOSIS — R112 Nausea with vomiting, unspecified: Secondary | ICD-10-CM | POA: Diagnosis not present

## 2022-04-04 DIAGNOSIS — C9000 Multiple myeloma not having achieved remission: Secondary | ICD-10-CM | POA: Diagnosis not present

## 2022-04-05 DIAGNOSIS — R112 Nausea with vomiting, unspecified: Secondary | ICD-10-CM | POA: Diagnosis not present

## 2022-04-05 DIAGNOSIS — C9000 Multiple myeloma not having achieved remission: Secondary | ICD-10-CM | POA: Diagnosis not present

## 2022-04-06 DIAGNOSIS — R112 Nausea with vomiting, unspecified: Secondary | ICD-10-CM | POA: Diagnosis not present

## 2022-04-06 DIAGNOSIS — C9000 Multiple myeloma not having achieved remission: Secondary | ICD-10-CM | POA: Diagnosis not present

## 2022-04-07 DIAGNOSIS — R112 Nausea with vomiting, unspecified: Secondary | ICD-10-CM | POA: Diagnosis not present

## 2022-04-07 DIAGNOSIS — C9000 Multiple myeloma not having achieved remission: Secondary | ICD-10-CM | POA: Diagnosis not present

## 2022-04-08 DIAGNOSIS — C9000 Multiple myeloma not having achieved remission: Secondary | ICD-10-CM | POA: Diagnosis not present

## 2022-04-08 DIAGNOSIS — R112 Nausea with vomiting, unspecified: Secondary | ICD-10-CM | POA: Diagnosis not present

## 2022-04-08 DIAGNOSIS — R531 Weakness: Secondary | ICD-10-CM | POA: Diagnosis not present

## 2022-04-09 DIAGNOSIS — R112 Nausea with vomiting, unspecified: Secondary | ICD-10-CM | POA: Diagnosis not present

## 2022-04-09 DIAGNOSIS — D61810 Antineoplastic chemotherapy induced pancytopenia: Secondary | ICD-10-CM | POA: Diagnosis not present

## 2022-04-09 DIAGNOSIS — R110 Nausea: Secondary | ICD-10-CM | POA: Diagnosis not present

## 2022-04-09 DIAGNOSIS — C9000 Multiple myeloma not having achieved remission: Secondary | ICD-10-CM | POA: Diagnosis not present

## 2022-04-09 DIAGNOSIS — K209 Esophagitis, unspecified without bleeding: Secondary | ICD-10-CM | POA: Diagnosis not present

## 2022-04-09 DIAGNOSIS — T451X5A Adverse effect of antineoplastic and immunosuppressive drugs, initial encounter: Secondary | ICD-10-CM | POA: Diagnosis not present

## 2022-04-09 DIAGNOSIS — Z7682 Awaiting organ transplant status: Secondary | ICD-10-CM | POA: Diagnosis not present

## 2022-04-09 DIAGNOSIS — Z9484 Stem cells transplant status: Secondary | ICD-10-CM | POA: Diagnosis not present

## 2022-04-09 DIAGNOSIS — D84822 Immunodeficiency due to external causes: Secondary | ICD-10-CM | POA: Diagnosis not present

## 2022-04-10 DIAGNOSIS — R509 Fever, unspecified: Secondary | ICD-10-CM | POA: Diagnosis not present

## 2022-04-10 DIAGNOSIS — R112 Nausea with vomiting, unspecified: Secondary | ICD-10-CM | POA: Diagnosis not present

## 2022-04-10 DIAGNOSIS — D84822 Immunodeficiency due to external causes: Secondary | ICD-10-CM | POA: Diagnosis not present

## 2022-04-10 DIAGNOSIS — R110 Nausea: Secondary | ICD-10-CM | POA: Diagnosis not present

## 2022-04-10 DIAGNOSIS — Z9484 Stem cells transplant status: Secondary | ICD-10-CM | POA: Diagnosis not present

## 2022-04-10 DIAGNOSIS — K209 Esophagitis, unspecified without bleeding: Secondary | ICD-10-CM | POA: Diagnosis not present

## 2022-04-10 DIAGNOSIS — T451X5A Adverse effect of antineoplastic and immunosuppressive drugs, initial encounter: Secondary | ICD-10-CM | POA: Diagnosis not present

## 2022-04-10 DIAGNOSIS — C9000 Multiple myeloma not having achieved remission: Secondary | ICD-10-CM | POA: Diagnosis not present

## 2022-04-10 DIAGNOSIS — Z7682 Awaiting organ transplant status: Secondary | ICD-10-CM | POA: Diagnosis not present

## 2022-04-10 DIAGNOSIS — D61810 Antineoplastic chemotherapy induced pancytopenia: Secondary | ICD-10-CM | POA: Diagnosis not present

## 2022-04-11 DIAGNOSIS — K209 Esophagitis, unspecified without bleeding: Secondary | ICD-10-CM | POA: Diagnosis not present

## 2022-04-11 DIAGNOSIS — N281 Cyst of kidney, acquired: Secondary | ICD-10-CM | POA: Diagnosis not present

## 2022-04-11 DIAGNOSIS — D84822 Immunodeficiency due to external causes: Secondary | ICD-10-CM | POA: Diagnosis not present

## 2022-04-11 DIAGNOSIS — R112 Nausea with vomiting, unspecified: Secondary | ICD-10-CM | POA: Diagnosis not present

## 2022-04-11 DIAGNOSIS — I708 Atherosclerosis of other arteries: Secondary | ICD-10-CM | POA: Diagnosis not present

## 2022-04-11 DIAGNOSIS — C9000 Multiple myeloma not having achieved remission: Secondary | ICD-10-CM | POA: Diagnosis not present

## 2022-04-11 DIAGNOSIS — I700 Atherosclerosis of aorta: Secondary | ICD-10-CM | POA: Diagnosis not present

## 2022-04-11 DIAGNOSIS — R110 Nausea: Secondary | ICD-10-CM | POA: Diagnosis not present

## 2022-04-11 DIAGNOSIS — Z9484 Stem cells transplant status: Secondary | ICD-10-CM | POA: Diagnosis not present

## 2022-04-11 DIAGNOSIS — Z7682 Awaiting organ transplant status: Secondary | ICD-10-CM | POA: Diagnosis not present

## 2022-04-11 DIAGNOSIS — I2510 Atherosclerotic heart disease of native coronary artery without angina pectoris: Secondary | ICD-10-CM | POA: Diagnosis not present

## 2022-04-11 DIAGNOSIS — K7689 Other specified diseases of liver: Secondary | ICD-10-CM | POA: Diagnosis not present

## 2022-04-11 DIAGNOSIS — T451X5A Adverse effect of antineoplastic and immunosuppressive drugs, initial encounter: Secondary | ICD-10-CM | POA: Diagnosis not present

## 2022-04-11 DIAGNOSIS — I3139 Other pericardial effusion (noninflammatory): Secondary | ICD-10-CM | POA: Diagnosis not present

## 2022-04-11 DIAGNOSIS — K2289 Other specified disease of esophagus: Secondary | ICD-10-CM | POA: Diagnosis not present

## 2022-04-11 DIAGNOSIS — D61810 Antineoplastic chemotherapy induced pancytopenia: Secondary | ICD-10-CM | POA: Diagnosis not present

## 2022-04-12 DIAGNOSIS — Z7682 Awaiting organ transplant status: Secondary | ICD-10-CM | POA: Diagnosis not present

## 2022-04-12 DIAGNOSIS — T451X5A Adverse effect of antineoplastic and immunosuppressive drugs, initial encounter: Secondary | ICD-10-CM | POA: Diagnosis not present

## 2022-04-12 DIAGNOSIS — R110 Nausea: Secondary | ICD-10-CM | POA: Diagnosis not present

## 2022-04-12 DIAGNOSIS — R112 Nausea with vomiting, unspecified: Secondary | ICD-10-CM | POA: Diagnosis not present

## 2022-04-12 DIAGNOSIS — Z9484 Stem cells transplant status: Secondary | ICD-10-CM | POA: Diagnosis not present

## 2022-04-12 DIAGNOSIS — C9000 Multiple myeloma not having achieved remission: Secondary | ICD-10-CM | POA: Diagnosis not present

## 2022-04-12 DIAGNOSIS — D84822 Immunodeficiency due to external causes: Secondary | ICD-10-CM | POA: Diagnosis not present

## 2022-04-12 DIAGNOSIS — K209 Esophagitis, unspecified without bleeding: Secondary | ICD-10-CM | POA: Diagnosis not present

## 2022-04-12 DIAGNOSIS — D61810 Antineoplastic chemotherapy induced pancytopenia: Secondary | ICD-10-CM | POA: Diagnosis not present

## 2022-04-13 DIAGNOSIS — K209 Esophagitis, unspecified without bleeding: Secondary | ICD-10-CM | POA: Diagnosis not present

## 2022-04-13 DIAGNOSIS — R110 Nausea: Secondary | ICD-10-CM | POA: Diagnosis not present

## 2022-04-13 DIAGNOSIS — C9000 Multiple myeloma not having achieved remission: Secondary | ICD-10-CM | POA: Diagnosis not present

## 2022-04-13 DIAGNOSIS — R112 Nausea with vomiting, unspecified: Secondary | ICD-10-CM | POA: Diagnosis not present

## 2022-04-13 DIAGNOSIS — D84822 Immunodeficiency due to external causes: Secondary | ICD-10-CM | POA: Diagnosis not present

## 2022-04-13 DIAGNOSIS — D61810 Antineoplastic chemotherapy induced pancytopenia: Secondary | ICD-10-CM | POA: Diagnosis not present

## 2022-04-13 DIAGNOSIS — T451X5A Adverse effect of antineoplastic and immunosuppressive drugs, initial encounter: Secondary | ICD-10-CM | POA: Diagnosis not present

## 2022-04-13 DIAGNOSIS — Z9484 Stem cells transplant status: Secondary | ICD-10-CM | POA: Diagnosis not present

## 2022-04-13 DIAGNOSIS — Z7682 Awaiting organ transplant status: Secondary | ICD-10-CM | POA: Diagnosis not present

## 2022-04-14 DIAGNOSIS — D84822 Immunodeficiency due to external causes: Secondary | ICD-10-CM | POA: Diagnosis not present

## 2022-04-14 DIAGNOSIS — K209 Esophagitis, unspecified without bleeding: Secondary | ICD-10-CM | POA: Diagnosis not present

## 2022-04-14 DIAGNOSIS — R110 Nausea: Secondary | ICD-10-CM | POA: Diagnosis not present

## 2022-04-14 DIAGNOSIS — R112 Nausea with vomiting, unspecified: Secondary | ICD-10-CM | POA: Diagnosis not present

## 2022-04-14 DIAGNOSIS — D61810 Antineoplastic chemotherapy induced pancytopenia: Secondary | ICD-10-CM | POA: Diagnosis not present

## 2022-04-14 DIAGNOSIS — Z7682 Awaiting organ transplant status: Secondary | ICD-10-CM | POA: Diagnosis not present

## 2022-04-14 DIAGNOSIS — C9000 Multiple myeloma not having achieved remission: Secondary | ICD-10-CM | POA: Diagnosis not present

## 2022-04-14 DIAGNOSIS — T451X5A Adverse effect of antineoplastic and immunosuppressive drugs, initial encounter: Secondary | ICD-10-CM | POA: Diagnosis not present

## 2022-04-14 DIAGNOSIS — Z9484 Stem cells transplant status: Secondary | ICD-10-CM | POA: Diagnosis not present

## 2022-04-15 DIAGNOSIS — R918 Other nonspecific abnormal finding of lung field: Secondary | ICD-10-CM | POA: Diagnosis not present

## 2022-04-15 DIAGNOSIS — C9000 Multiple myeloma not having achieved remission: Secondary | ICD-10-CM | POA: Diagnosis not present

## 2022-04-15 DIAGNOSIS — R112 Nausea with vomiting, unspecified: Secondary | ICD-10-CM | POA: Diagnosis not present

## 2022-04-15 DIAGNOSIS — R509 Fever, unspecified: Secondary | ICD-10-CM | POA: Diagnosis not present

## 2022-04-16 DIAGNOSIS — R112 Nausea with vomiting, unspecified: Secondary | ICD-10-CM | POA: Diagnosis not present

## 2022-04-16 DIAGNOSIS — C9000 Multiple myeloma not having achieved remission: Secondary | ICD-10-CM | POA: Diagnosis not present

## 2022-04-16 DIAGNOSIS — R0989 Other specified symptoms and signs involving the circulatory and respiratory systems: Secondary | ICD-10-CM | POA: Diagnosis not present

## 2022-04-17 DIAGNOSIS — C9000 Multiple myeloma not having achieved remission: Secondary | ICD-10-CM | POA: Diagnosis not present

## 2022-04-17 DIAGNOSIS — R112 Nausea with vomiting, unspecified: Secondary | ICD-10-CM | POA: Diagnosis not present

## 2022-04-17 DIAGNOSIS — I3139 Other pericardial effusion (noninflammatory): Secondary | ICD-10-CM | POA: Diagnosis not present

## 2022-04-17 DIAGNOSIS — J90 Pleural effusion, not elsewhere classified: Secondary | ICD-10-CM | POA: Diagnosis not present

## 2022-04-17 DIAGNOSIS — R0989 Other specified symptoms and signs involving the circulatory and respiratory systems: Secondary | ICD-10-CM | POA: Diagnosis not present

## 2022-04-17 DIAGNOSIS — R296 Repeated falls: Secondary | ICD-10-CM | POA: Diagnosis not present

## 2022-04-17 DIAGNOSIS — N281 Cyst of kidney, acquired: Secondary | ICD-10-CM | POA: Diagnosis not present

## 2022-04-17 DIAGNOSIS — J810 Acute pulmonary edema: Secondary | ICD-10-CM | POA: Diagnosis not present

## 2022-04-17 DIAGNOSIS — Z9049 Acquired absence of other specified parts of digestive tract: Secondary | ICD-10-CM | POA: Diagnosis not present

## 2022-04-17 DIAGNOSIS — K7689 Other specified diseases of liver: Secondary | ICD-10-CM | POA: Diagnosis not present

## 2022-04-18 DIAGNOSIS — R112 Nausea with vomiting, unspecified: Secondary | ICD-10-CM | POA: Diagnosis not present

## 2022-04-18 DIAGNOSIS — C9000 Multiple myeloma not having achieved remission: Secondary | ICD-10-CM | POA: Diagnosis not present

## 2022-04-18 DIAGNOSIS — R0989 Other specified symptoms and signs involving the circulatory and respiratory systems: Secondary | ICD-10-CM | POA: Diagnosis not present

## 2022-04-19 DIAGNOSIS — C9000 Multiple myeloma not having achieved remission: Secondary | ICD-10-CM | POA: Diagnosis not present

## 2022-04-19 DIAGNOSIS — R0989 Other specified symptoms and signs involving the circulatory and respiratory systems: Secondary | ICD-10-CM | POA: Diagnosis not present

## 2022-04-19 DIAGNOSIS — R112 Nausea with vomiting, unspecified: Secondary | ICD-10-CM | POA: Diagnosis not present

## 2022-04-20 DIAGNOSIS — R112 Nausea with vomiting, unspecified: Secondary | ICD-10-CM | POA: Diagnosis not present

## 2022-04-20 DIAGNOSIS — R0989 Other specified symptoms and signs involving the circulatory and respiratory systems: Secondary | ICD-10-CM | POA: Diagnosis not present

## 2022-04-20 DIAGNOSIS — C9000 Multiple myeloma not having achieved remission: Secondary | ICD-10-CM | POA: Diagnosis not present

## 2022-04-21 DIAGNOSIS — D84822 Immunodeficiency due to external causes: Secondary | ICD-10-CM | POA: Diagnosis not present

## 2022-04-21 DIAGNOSIS — D61810 Antineoplastic chemotherapy induced pancytopenia: Secondary | ICD-10-CM | POA: Diagnosis not present

## 2022-04-21 DIAGNOSIS — T451X5A Adverse effect of antineoplastic and immunosuppressive drugs, initial encounter: Secondary | ICD-10-CM | POA: Diagnosis not present

## 2022-04-21 DIAGNOSIS — Z9484 Stem cells transplant status: Secondary | ICD-10-CM | POA: Diagnosis not present

## 2022-04-21 DIAGNOSIS — R112 Nausea with vomiting, unspecified: Secondary | ICD-10-CM | POA: Diagnosis not present

## 2022-04-21 DIAGNOSIS — C9000 Multiple myeloma not having achieved remission: Secondary | ICD-10-CM | POA: Diagnosis not present

## 2022-04-21 DIAGNOSIS — Z7682 Awaiting organ transplant status: Secondary | ICD-10-CM | POA: Diagnosis not present

## 2022-04-21 DIAGNOSIS — R110 Nausea: Secondary | ICD-10-CM | POA: Diagnosis not present

## 2022-04-22 DIAGNOSIS — K209 Esophagitis, unspecified without bleeding: Secondary | ICD-10-CM | POA: Diagnosis not present

## 2022-04-22 DIAGNOSIS — R112 Nausea with vomiting, unspecified: Secondary | ICD-10-CM | POA: Diagnosis not present

## 2022-04-22 DIAGNOSIS — C9000 Multiple myeloma not having achieved remission: Secondary | ICD-10-CM | POA: Diagnosis not present

## 2022-04-22 DIAGNOSIS — Z7682 Awaiting organ transplant status: Secondary | ICD-10-CM | POA: Diagnosis not present

## 2022-04-23 DIAGNOSIS — Z7682 Awaiting organ transplant status: Secondary | ICD-10-CM | POA: Diagnosis not present

## 2022-04-23 DIAGNOSIS — R112 Nausea with vomiting, unspecified: Secondary | ICD-10-CM | POA: Diagnosis not present

## 2022-04-23 DIAGNOSIS — K209 Esophagitis, unspecified without bleeding: Secondary | ICD-10-CM | POA: Diagnosis not present

## 2022-04-23 DIAGNOSIS — C9000 Multiple myeloma not having achieved remission: Secondary | ICD-10-CM | POA: Diagnosis not present

## 2022-04-24 DIAGNOSIS — R112 Nausea with vomiting, unspecified: Secondary | ICD-10-CM | POA: Diagnosis not present

## 2022-04-24 DIAGNOSIS — C9000 Multiple myeloma not having achieved remission: Secondary | ICD-10-CM | POA: Diagnosis not present

## 2022-04-24 DIAGNOSIS — Z7682 Awaiting organ transplant status: Secondary | ICD-10-CM | POA: Diagnosis not present

## 2022-04-24 DIAGNOSIS — K209 Esophagitis, unspecified without bleeding: Secondary | ICD-10-CM | POA: Diagnosis not present

## 2022-04-25 DIAGNOSIS — Z7682 Awaiting organ transplant status: Secondary | ICD-10-CM | POA: Diagnosis not present

## 2022-04-25 DIAGNOSIS — R112 Nausea with vomiting, unspecified: Secondary | ICD-10-CM | POA: Diagnosis not present

## 2022-04-25 DIAGNOSIS — K209 Esophagitis, unspecified without bleeding: Secondary | ICD-10-CM | POA: Diagnosis not present

## 2022-04-25 DIAGNOSIS — C9000 Multiple myeloma not having achieved remission: Secondary | ICD-10-CM | POA: Diagnosis not present

## 2022-04-26 DIAGNOSIS — R112 Nausea with vomiting, unspecified: Secondary | ICD-10-CM | POA: Diagnosis not present

## 2022-04-26 DIAGNOSIS — C9000 Multiple myeloma not having achieved remission: Secondary | ICD-10-CM | POA: Diagnosis not present

## 2022-04-26 DIAGNOSIS — R0989 Other specified symptoms and signs involving the circulatory and respiratory systems: Secondary | ICD-10-CM | POA: Diagnosis not present

## 2022-04-28 DIAGNOSIS — C9000 Multiple myeloma not having achieved remission: Secondary | ICD-10-CM | POA: Diagnosis not present

## 2022-04-28 DIAGNOSIS — Z9484 Stem cells transplant status: Secondary | ICD-10-CM | POA: Diagnosis not present

## 2022-04-28 DIAGNOSIS — R110 Nausea: Secondary | ICD-10-CM | POA: Diagnosis not present

## 2022-04-28 DIAGNOSIS — R000 Tachycardia, unspecified: Secondary | ICD-10-CM | POA: Diagnosis not present

## 2022-04-28 DIAGNOSIS — E876 Hypokalemia: Secondary | ICD-10-CM | POA: Diagnosis not present

## 2022-04-28 DIAGNOSIS — I959 Hypotension, unspecified: Secondary | ICD-10-CM | POA: Diagnosis not present

## 2022-04-29 DIAGNOSIS — R627 Adult failure to thrive: Secondary | ICD-10-CM | POA: Diagnosis not present

## 2022-04-29 DIAGNOSIS — R531 Weakness: Secondary | ICD-10-CM | POA: Diagnosis not present

## 2022-04-29 DIAGNOSIS — S066X9A Traumatic subarachnoid hemorrhage with loss of consciousness of unspecified duration, initial encounter: Secondary | ICD-10-CM | POA: Diagnosis not present

## 2022-04-29 DIAGNOSIS — E860 Dehydration: Secondary | ICD-10-CM | POA: Diagnosis not present

## 2022-04-29 DIAGNOSIS — Z9484 Stem cells transplant status: Secondary | ICD-10-CM | POA: Diagnosis not present

## 2022-04-29 DIAGNOSIS — S0990XA Unspecified injury of head, initial encounter: Secondary | ICD-10-CM | POA: Diagnosis not present

## 2022-04-29 DIAGNOSIS — I10 Essential (primary) hypertension: Secondary | ICD-10-CM | POA: Diagnosis not present

## 2022-04-29 DIAGNOSIS — C9000 Multiple myeloma not having achieved remission: Secondary | ICD-10-CM | POA: Diagnosis not present

## 2022-04-29 DIAGNOSIS — G909 Disorder of the autonomic nervous system, unspecified: Secondary | ICD-10-CM | POA: Diagnosis not present

## 2022-04-29 DIAGNOSIS — I951 Orthostatic hypotension: Secondary | ICD-10-CM | POA: Diagnosis not present

## 2022-04-30 DIAGNOSIS — Z9484 Stem cells transplant status: Secondary | ICD-10-CM | POA: Diagnosis not present

## 2022-04-30 DIAGNOSIS — C9000 Multiple myeloma not having achieved remission: Secondary | ICD-10-CM | POA: Diagnosis not present

## 2022-04-30 DIAGNOSIS — I611 Nontraumatic intracerebral hemorrhage in hemisphere, cortical: Secondary | ICD-10-CM | POA: Diagnosis not present

## 2022-04-30 DIAGNOSIS — F331 Major depressive disorder, recurrent, moderate: Secondary | ICD-10-CM | POA: Diagnosis not present

## 2022-04-30 DIAGNOSIS — I10 Essential (primary) hypertension: Secondary | ICD-10-CM | POA: Diagnosis not present

## 2022-04-30 DIAGNOSIS — R627 Adult failure to thrive: Secondary | ICD-10-CM | POA: Diagnosis not present

## 2022-04-30 DIAGNOSIS — R69 Illness, unspecified: Secondary | ICD-10-CM | POA: Diagnosis not present

## 2022-04-30 DIAGNOSIS — R45851 Suicidal ideations: Secondary | ICD-10-CM | POA: Diagnosis not present

## 2022-05-01 DIAGNOSIS — C9000 Multiple myeloma not having achieved remission: Secondary | ICD-10-CM | POA: Diagnosis not present

## 2022-05-01 DIAGNOSIS — I10 Essential (primary) hypertension: Secondary | ICD-10-CM | POA: Diagnosis not present

## 2022-05-01 DIAGNOSIS — R627 Adult failure to thrive: Secondary | ICD-10-CM | POA: Diagnosis not present

## 2022-05-01 DIAGNOSIS — R69 Illness, unspecified: Secondary | ICD-10-CM | POA: Diagnosis not present

## 2022-05-01 DIAGNOSIS — Z9484 Stem cells transplant status: Secondary | ICD-10-CM | POA: Diagnosis not present

## 2022-05-02 DIAGNOSIS — R627 Adult failure to thrive: Secondary | ICD-10-CM | POA: Diagnosis not present

## 2022-05-02 DIAGNOSIS — C9000 Multiple myeloma not having achieved remission: Secondary | ICD-10-CM | POA: Diagnosis not present

## 2022-05-02 DIAGNOSIS — R69 Illness, unspecified: Secondary | ICD-10-CM | POA: Diagnosis not present

## 2022-05-02 DIAGNOSIS — I10 Essential (primary) hypertension: Secondary | ICD-10-CM | POA: Diagnosis not present

## 2022-05-02 DIAGNOSIS — Z9484 Stem cells transplant status: Secondary | ICD-10-CM | POA: Diagnosis not present

## 2022-05-03 DIAGNOSIS — Z9484 Stem cells transplant status: Secondary | ICD-10-CM | POA: Diagnosis not present

## 2022-05-03 DIAGNOSIS — C9000 Multiple myeloma not having achieved remission: Secondary | ICD-10-CM | POA: Diagnosis not present

## 2022-05-03 DIAGNOSIS — R627 Adult failure to thrive: Secondary | ICD-10-CM | POA: Diagnosis not present

## 2022-05-03 DIAGNOSIS — I10 Essential (primary) hypertension: Secondary | ICD-10-CM | POA: Diagnosis not present

## 2022-05-03 DIAGNOSIS — R69 Illness, unspecified: Secondary | ICD-10-CM | POA: Diagnosis not present

## 2022-05-04 DIAGNOSIS — Z9484 Stem cells transplant status: Secondary | ICD-10-CM | POA: Diagnosis not present

## 2022-05-04 DIAGNOSIS — I10 Essential (primary) hypertension: Secondary | ICD-10-CM | POA: Diagnosis not present

## 2022-05-04 DIAGNOSIS — R627 Adult failure to thrive: Secondary | ICD-10-CM | POA: Diagnosis not present

## 2022-05-04 DIAGNOSIS — R69 Illness, unspecified: Secondary | ICD-10-CM | POA: Diagnosis not present

## 2022-05-04 DIAGNOSIS — C9000 Multiple myeloma not having achieved remission: Secondary | ICD-10-CM | POA: Diagnosis not present

## 2022-05-05 DIAGNOSIS — R112 Nausea with vomiting, unspecified: Secondary | ICD-10-CM | POA: Diagnosis not present

## 2022-05-05 DIAGNOSIS — Z9484 Stem cells transplant status: Secondary | ICD-10-CM | POA: Diagnosis not present

## 2022-05-05 DIAGNOSIS — I10 Essential (primary) hypertension: Secondary | ICD-10-CM | POA: Diagnosis not present

## 2022-05-05 DIAGNOSIS — R69 Illness, unspecified: Secondary | ICD-10-CM | POA: Diagnosis not present

## 2022-05-05 DIAGNOSIS — R627 Adult failure to thrive: Secondary | ICD-10-CM | POA: Diagnosis not present

## 2022-05-06 DIAGNOSIS — Z9484 Stem cells transplant status: Secondary | ICD-10-CM | POA: Diagnosis not present

## 2022-05-06 DIAGNOSIS — I10 Essential (primary) hypertension: Secondary | ICD-10-CM | POA: Diagnosis not present

## 2022-05-06 DIAGNOSIS — D638 Anemia in other chronic diseases classified elsewhere: Secondary | ICD-10-CM | POA: Diagnosis not present

## 2022-05-06 DIAGNOSIS — T50905A Adverse effect of unspecified drugs, medicaments and biological substances, initial encounter: Secondary | ICD-10-CM | POA: Diagnosis not present

## 2022-05-06 DIAGNOSIS — D6959 Other secondary thrombocytopenia: Secondary | ICD-10-CM | POA: Diagnosis not present

## 2022-05-06 DIAGNOSIS — R627 Adult failure to thrive: Secondary | ICD-10-CM | POA: Diagnosis not present

## 2022-05-06 DIAGNOSIS — C9000 Multiple myeloma not having achieved remission: Secondary | ICD-10-CM | POA: Diagnosis not present

## 2022-05-06 DIAGNOSIS — R69 Illness, unspecified: Secondary | ICD-10-CM | POA: Diagnosis not present

## 2022-05-06 DIAGNOSIS — R112 Nausea with vomiting, unspecified: Secondary | ICD-10-CM | POA: Diagnosis not present

## 2022-05-07 DIAGNOSIS — R000 Tachycardia, unspecified: Secondary | ICD-10-CM | POA: Diagnosis not present

## 2022-05-07 DIAGNOSIS — I10 Essential (primary) hypertension: Secondary | ICD-10-CM | POA: Diagnosis not present

## 2022-05-07 DIAGNOSIS — C9000 Multiple myeloma not having achieved remission: Secondary | ICD-10-CM | POA: Diagnosis not present

## 2022-05-07 DIAGNOSIS — G909 Disorder of the autonomic nervous system, unspecified: Secondary | ICD-10-CM | POA: Diagnosis not present

## 2022-05-07 DIAGNOSIS — R627 Adult failure to thrive: Secondary | ICD-10-CM | POA: Diagnosis not present

## 2022-05-07 DIAGNOSIS — T50905A Adverse effect of unspecified drugs, medicaments and biological substances, initial encounter: Secondary | ICD-10-CM | POA: Diagnosis not present

## 2022-05-07 DIAGNOSIS — M79604 Pain in right leg: Secondary | ICD-10-CM | POA: Diagnosis not present

## 2022-05-07 DIAGNOSIS — D6959 Other secondary thrombocytopenia: Secondary | ICD-10-CM | POA: Diagnosis not present

## 2022-05-07 DIAGNOSIS — D638 Anemia in other chronic diseases classified elsewhere: Secondary | ICD-10-CM | POA: Diagnosis not present

## 2022-05-07 DIAGNOSIS — R69 Illness, unspecified: Secondary | ICD-10-CM | POA: Diagnosis not present

## 2022-05-07 DIAGNOSIS — R112 Nausea with vomiting, unspecified: Secondary | ICD-10-CM | POA: Diagnosis not present

## 2022-05-07 DIAGNOSIS — I951 Orthostatic hypotension: Secondary | ICD-10-CM | POA: Diagnosis not present

## 2022-05-07 DIAGNOSIS — Z9484 Stem cells transplant status: Secondary | ICD-10-CM | POA: Diagnosis not present

## 2022-05-07 DIAGNOSIS — G250 Essential tremor: Secondary | ICD-10-CM | POA: Diagnosis not present

## 2022-05-07 DIAGNOSIS — G9340 Encephalopathy, unspecified: Secondary | ICD-10-CM | POA: Diagnosis not present

## 2022-05-08 DIAGNOSIS — I10 Essential (primary) hypertension: Secondary | ICD-10-CM | POA: Diagnosis not present

## 2022-05-08 DIAGNOSIS — T50905A Adverse effect of unspecified drugs, medicaments and biological substances, initial encounter: Secondary | ICD-10-CM | POA: Diagnosis not present

## 2022-05-08 DIAGNOSIS — Z9484 Stem cells transplant status: Secondary | ICD-10-CM | POA: Diagnosis not present

## 2022-05-08 DIAGNOSIS — I959 Hypotension, unspecified: Secondary | ICD-10-CM | POA: Diagnosis not present

## 2022-05-08 DIAGNOSIS — R627 Adult failure to thrive: Secondary | ICD-10-CM | POA: Diagnosis not present

## 2022-05-08 DIAGNOSIS — R918 Other nonspecific abnormal finding of lung field: Secondary | ICD-10-CM | POA: Diagnosis not present

## 2022-05-08 DIAGNOSIS — R112 Nausea with vomiting, unspecified: Secondary | ICD-10-CM | POA: Diagnosis not present

## 2022-05-08 DIAGNOSIS — C9000 Multiple myeloma not having achieved remission: Secondary | ICD-10-CM | POA: Diagnosis not present

## 2022-05-08 DIAGNOSIS — D638 Anemia in other chronic diseases classified elsewhere: Secondary | ICD-10-CM | POA: Diagnosis not present

## 2022-05-08 DIAGNOSIS — D6959 Other secondary thrombocytopenia: Secondary | ICD-10-CM | POA: Diagnosis not present

## 2022-05-08 DIAGNOSIS — I517 Cardiomegaly: Secondary | ICD-10-CM | POA: Diagnosis not present

## 2022-05-08 DIAGNOSIS — I3139 Other pericardial effusion (noninflammatory): Secondary | ICD-10-CM | POA: Diagnosis not present

## 2022-05-08 DIAGNOSIS — R69 Illness, unspecified: Secondary | ICD-10-CM | POA: Diagnosis not present

## 2022-05-09 DIAGNOSIS — T50905A Adverse effect of unspecified drugs, medicaments and biological substances, initial encounter: Secondary | ICD-10-CM | POA: Diagnosis not present

## 2022-05-09 DIAGNOSIS — D638 Anemia in other chronic diseases classified elsewhere: Secondary | ICD-10-CM | POA: Diagnosis not present

## 2022-05-09 DIAGNOSIS — Z9484 Stem cells transplant status: Secondary | ICD-10-CM | POA: Diagnosis not present

## 2022-05-09 DIAGNOSIS — I10 Essential (primary) hypertension: Secondary | ICD-10-CM | POA: Diagnosis not present

## 2022-05-09 DIAGNOSIS — R112 Nausea with vomiting, unspecified: Secondary | ICD-10-CM | POA: Diagnosis not present

## 2022-05-09 DIAGNOSIS — R69 Illness, unspecified: Secondary | ICD-10-CM | POA: Diagnosis not present

## 2022-05-09 DIAGNOSIS — C9000 Multiple myeloma not having achieved remission: Secondary | ICD-10-CM | POA: Diagnosis not present

## 2022-05-09 DIAGNOSIS — R627 Adult failure to thrive: Secondary | ICD-10-CM | POA: Diagnosis not present

## 2022-05-09 DIAGNOSIS — Z452 Encounter for adjustment and management of vascular access device: Secondary | ICD-10-CM | POA: Diagnosis not present

## 2022-05-09 DIAGNOSIS — D6959 Other secondary thrombocytopenia: Secondary | ICD-10-CM | POA: Diagnosis not present

## 2022-05-10 DIAGNOSIS — I10 Essential (primary) hypertension: Secondary | ICD-10-CM | POA: Diagnosis not present

## 2022-05-10 DIAGNOSIS — R112 Nausea with vomiting, unspecified: Secondary | ICD-10-CM | POA: Diagnosis not present

## 2022-05-10 DIAGNOSIS — Z9484 Stem cells transplant status: Secondary | ICD-10-CM | POA: Diagnosis not present

## 2022-05-10 DIAGNOSIS — R627 Adult failure to thrive: Secondary | ICD-10-CM | POA: Diagnosis not present

## 2022-05-13 DIAGNOSIS — I951 Orthostatic hypotension: Secondary | ICD-10-CM | POA: Diagnosis not present

## 2022-05-13 DIAGNOSIS — G909 Disorder of the autonomic nervous system, unspecified: Secondary | ICD-10-CM | POA: Diagnosis not present

## 2022-05-13 DIAGNOSIS — I10 Essential (primary) hypertension: Secondary | ICD-10-CM | POA: Diagnosis not present

## 2022-05-13 DIAGNOSIS — M899 Disorder of bone, unspecified: Secondary | ICD-10-CM | POA: Diagnosis not present

## 2022-05-13 DIAGNOSIS — T50905A Adverse effect of unspecified drugs, medicaments and biological substances, initial encounter: Secondary | ICD-10-CM | POA: Diagnosis not present

## 2022-05-13 DIAGNOSIS — M810 Age-related osteoporosis without current pathological fracture: Secondary | ICD-10-CM | POA: Diagnosis not present

## 2022-05-13 DIAGNOSIS — C9000 Multiple myeloma not having achieved remission: Secondary | ICD-10-CM | POA: Diagnosis not present

## 2022-05-13 DIAGNOSIS — Z9484 Stem cells transplant status: Secondary | ICD-10-CM | POA: Diagnosis not present

## 2022-05-13 DIAGNOSIS — G620 Drug-induced polyneuropathy: Secondary | ICD-10-CM | POA: Diagnosis not present

## 2022-05-13 DIAGNOSIS — Z79899 Other long term (current) drug therapy: Secondary | ICD-10-CM | POA: Diagnosis not present

## 2022-05-13 DIAGNOSIS — E785 Hyperlipidemia, unspecified: Secondary | ICD-10-CM | POA: Diagnosis not present

## 2022-05-13 DIAGNOSIS — Z87891 Personal history of nicotine dependence: Secondary | ICD-10-CM | POA: Diagnosis not present

## 2022-05-13 DIAGNOSIS — G250 Essential tremor: Secondary | ICD-10-CM | POA: Diagnosis not present

## 2022-05-14 DIAGNOSIS — D689 Coagulation defect, unspecified: Secondary | ICD-10-CM | POA: Diagnosis not present

## 2022-05-14 DIAGNOSIS — Z7952 Long term (current) use of systemic steroids: Secondary | ICD-10-CM | POA: Diagnosis not present

## 2022-05-14 DIAGNOSIS — Z6829 Body mass index (BMI) 29.0-29.9, adult: Secondary | ICD-10-CM | POA: Diagnosis not present

## 2022-05-14 DIAGNOSIS — G250 Essential tremor: Secondary | ICD-10-CM | POA: Diagnosis not present

## 2022-05-14 DIAGNOSIS — M810 Age-related osteoporosis without current pathological fracture: Secondary | ICD-10-CM | POA: Diagnosis not present

## 2022-05-14 DIAGNOSIS — R530 Neoplastic (malignant) related fatigue: Secondary | ICD-10-CM | POA: Diagnosis not present

## 2022-05-14 DIAGNOSIS — Z9484 Stem cells transplant status: Secondary | ICD-10-CM | POA: Diagnosis not present

## 2022-05-14 DIAGNOSIS — C9000 Multiple myeloma not having achieved remission: Secondary | ICD-10-CM | POA: Diagnosis not present

## 2022-05-14 DIAGNOSIS — E876 Hypokalemia: Secondary | ICD-10-CM | POA: Diagnosis not present

## 2022-05-14 DIAGNOSIS — K449 Diaphragmatic hernia without obstruction or gangrene: Secondary | ICD-10-CM | POA: Diagnosis not present

## 2022-05-14 DIAGNOSIS — D630 Anemia in neoplastic disease: Secondary | ICD-10-CM | POA: Diagnosis not present

## 2022-05-14 DIAGNOSIS — R296 Repeated falls: Secondary | ICD-10-CM | POA: Diagnosis not present

## 2022-05-14 DIAGNOSIS — F419 Anxiety disorder, unspecified: Secondary | ICD-10-CM | POA: Diagnosis not present

## 2022-05-14 DIAGNOSIS — D696 Thrombocytopenia, unspecified: Secondary | ICD-10-CM | POA: Diagnosis not present

## 2022-05-14 DIAGNOSIS — S065X0D Traumatic subdural hemorrhage without loss of consciousness, subsequent encounter: Secondary | ICD-10-CM | POA: Diagnosis not present

## 2022-05-14 DIAGNOSIS — K219 Gastro-esophageal reflux disease without esophagitis: Secondary | ICD-10-CM | POA: Diagnosis not present

## 2022-05-14 DIAGNOSIS — I10 Essential (primary) hypertension: Secondary | ICD-10-CM | POA: Diagnosis not present

## 2022-05-14 DIAGNOSIS — E663 Overweight: Secondary | ICD-10-CM | POA: Diagnosis not present

## 2022-05-14 DIAGNOSIS — Z87891 Personal history of nicotine dependence: Secondary | ICD-10-CM | POA: Diagnosis not present

## 2022-05-14 DIAGNOSIS — E861 Hypovolemia: Secondary | ICD-10-CM | POA: Diagnosis not present

## 2022-05-14 DIAGNOSIS — M1990 Unspecified osteoarthritis, unspecified site: Secondary | ICD-10-CM | POA: Diagnosis not present

## 2022-05-14 DIAGNOSIS — G8929 Other chronic pain: Secondary | ICD-10-CM | POA: Diagnosis not present

## 2022-05-14 DIAGNOSIS — Z79899 Other long term (current) drug therapy: Secondary | ICD-10-CM | POA: Diagnosis not present

## 2022-05-14 DIAGNOSIS — K760 Fatty (change of) liver, not elsewhere classified: Secondary | ICD-10-CM | POA: Diagnosis not present

## 2022-05-14 DIAGNOSIS — E785 Hyperlipidemia, unspecified: Secondary | ICD-10-CM | POA: Diagnosis not present

## 2022-05-14 DIAGNOSIS — Z9181 History of falling: Secondary | ICD-10-CM | POA: Diagnosis not present

## 2022-05-14 DIAGNOSIS — F3342 Major depressive disorder, recurrent, in full remission: Secondary | ICD-10-CM | POA: Diagnosis not present

## 2022-05-15 DIAGNOSIS — C9000 Multiple myeloma not having achieved remission: Secondary | ICD-10-CM | POA: Diagnosis not present

## 2022-05-15 DIAGNOSIS — G620 Drug-induced polyneuropathy: Secondary | ICD-10-CM | POA: Diagnosis not present

## 2022-05-15 DIAGNOSIS — Z9484 Stem cells transplant status: Secondary | ICD-10-CM | POA: Diagnosis not present

## 2022-05-15 DIAGNOSIS — I951 Orthostatic hypotension: Secondary | ICD-10-CM | POA: Diagnosis not present

## 2022-05-15 DIAGNOSIS — G250 Essential tremor: Secondary | ICD-10-CM | POA: Diagnosis not present

## 2022-05-15 DIAGNOSIS — G909 Disorder of the autonomic nervous system, unspecified: Secondary | ICD-10-CM | POA: Diagnosis not present

## 2022-05-19 DIAGNOSIS — Z6829 Body mass index (BMI) 29.0-29.9, adult: Secondary | ICD-10-CM | POA: Diagnosis not present

## 2022-05-19 DIAGNOSIS — D696 Thrombocytopenia, unspecified: Secondary | ICD-10-CM | POA: Diagnosis not present

## 2022-05-19 DIAGNOSIS — F3342 Major depressive disorder, recurrent, in full remission: Secondary | ICD-10-CM | POA: Diagnosis not present

## 2022-05-19 DIAGNOSIS — E663 Overweight: Secondary | ICD-10-CM | POA: Diagnosis not present

## 2022-05-19 DIAGNOSIS — S065X0D Traumatic subdural hemorrhage without loss of consciousness, subsequent encounter: Secondary | ICD-10-CM | POA: Diagnosis not present

## 2022-05-19 DIAGNOSIS — E876 Hypokalemia: Secondary | ICD-10-CM | POA: Diagnosis not present

## 2022-05-19 DIAGNOSIS — Z79899 Other long term (current) drug therapy: Secondary | ICD-10-CM | POA: Diagnosis not present

## 2022-05-19 DIAGNOSIS — R530 Neoplastic (malignant) related fatigue: Secondary | ICD-10-CM | POA: Diagnosis not present

## 2022-05-19 DIAGNOSIS — C9000 Multiple myeloma not having achieved remission: Secondary | ICD-10-CM | POA: Diagnosis not present

## 2022-05-19 DIAGNOSIS — G8929 Other chronic pain: Secondary | ICD-10-CM | POA: Diagnosis not present

## 2022-05-19 DIAGNOSIS — E861 Hypovolemia: Secondary | ICD-10-CM | POA: Diagnosis not present

## 2022-05-19 DIAGNOSIS — M810 Age-related osteoporosis without current pathological fracture: Secondary | ICD-10-CM | POA: Diagnosis not present

## 2022-05-19 DIAGNOSIS — Z9484 Stem cells transplant status: Secondary | ICD-10-CM | POA: Diagnosis not present

## 2022-05-19 DIAGNOSIS — D630 Anemia in neoplastic disease: Secondary | ICD-10-CM | POA: Diagnosis not present

## 2022-05-19 DIAGNOSIS — E785 Hyperlipidemia, unspecified: Secondary | ICD-10-CM | POA: Diagnosis not present

## 2022-05-19 DIAGNOSIS — I10 Essential (primary) hypertension: Secondary | ICD-10-CM | POA: Diagnosis not present

## 2022-05-19 DIAGNOSIS — K449 Diaphragmatic hernia without obstruction or gangrene: Secondary | ICD-10-CM | POA: Diagnosis not present

## 2022-05-19 DIAGNOSIS — G250 Essential tremor: Secondary | ICD-10-CM | POA: Diagnosis not present

## 2022-05-19 DIAGNOSIS — F419 Anxiety disorder, unspecified: Secondary | ICD-10-CM | POA: Diagnosis not present

## 2022-05-19 DIAGNOSIS — K219 Gastro-esophageal reflux disease without esophagitis: Secondary | ICD-10-CM | POA: Diagnosis not present

## 2022-05-19 DIAGNOSIS — Z7952 Long term (current) use of systemic steroids: Secondary | ICD-10-CM | POA: Diagnosis not present

## 2022-05-19 DIAGNOSIS — K760 Fatty (change of) liver, not elsewhere classified: Secondary | ICD-10-CM | POA: Diagnosis not present

## 2022-05-19 DIAGNOSIS — M1990 Unspecified osteoarthritis, unspecified site: Secondary | ICD-10-CM | POA: Diagnosis not present

## 2022-05-20 DIAGNOSIS — G909 Disorder of the autonomic nervous system, unspecified: Secondary | ICD-10-CM | POA: Diagnosis not present

## 2022-05-20 DIAGNOSIS — C9000 Multiple myeloma not having achieved remission: Secondary | ICD-10-CM | POA: Diagnosis not present

## 2022-05-20 DIAGNOSIS — Z9484 Stem cells transplant status: Secondary | ICD-10-CM | POA: Diagnosis not present

## 2022-05-20 DIAGNOSIS — I951 Orthostatic hypotension: Secondary | ICD-10-CM | POA: Diagnosis not present

## 2022-05-20 DIAGNOSIS — G620 Drug-induced polyneuropathy: Secondary | ICD-10-CM | POA: Diagnosis not present

## 2022-05-20 DIAGNOSIS — G250 Essential tremor: Secondary | ICD-10-CM | POA: Diagnosis not present

## 2022-05-21 DIAGNOSIS — Z79899 Other long term (current) drug therapy: Secondary | ICD-10-CM | POA: Diagnosis not present

## 2022-05-21 DIAGNOSIS — C9000 Multiple myeloma not having achieved remission: Secondary | ICD-10-CM | POA: Diagnosis not present

## 2022-05-21 DIAGNOSIS — D696 Thrombocytopenia, unspecified: Secondary | ICD-10-CM | POA: Diagnosis not present

## 2022-05-21 DIAGNOSIS — E663 Overweight: Secondary | ICD-10-CM | POA: Diagnosis not present

## 2022-05-21 DIAGNOSIS — M810 Age-related osteoporosis without current pathological fracture: Secondary | ICD-10-CM | POA: Diagnosis not present

## 2022-05-21 DIAGNOSIS — F3342 Major depressive disorder, recurrent, in full remission: Secondary | ICD-10-CM | POA: Diagnosis not present

## 2022-05-21 DIAGNOSIS — R530 Neoplastic (malignant) related fatigue: Secondary | ICD-10-CM | POA: Diagnosis not present

## 2022-05-21 DIAGNOSIS — S065X0D Traumatic subdural hemorrhage without loss of consciousness, subsequent encounter: Secondary | ICD-10-CM | POA: Diagnosis not present

## 2022-05-21 DIAGNOSIS — K449 Diaphragmatic hernia without obstruction or gangrene: Secondary | ICD-10-CM | POA: Diagnosis not present

## 2022-05-21 DIAGNOSIS — G8929 Other chronic pain: Secondary | ICD-10-CM | POA: Diagnosis not present

## 2022-05-21 DIAGNOSIS — M1990 Unspecified osteoarthritis, unspecified site: Secondary | ICD-10-CM | POA: Diagnosis not present

## 2022-05-21 DIAGNOSIS — Z6829 Body mass index (BMI) 29.0-29.9, adult: Secondary | ICD-10-CM | POA: Diagnosis not present

## 2022-05-21 DIAGNOSIS — K219 Gastro-esophageal reflux disease without esophagitis: Secondary | ICD-10-CM | POA: Diagnosis not present

## 2022-05-21 DIAGNOSIS — G250 Essential tremor: Secondary | ICD-10-CM | POA: Diagnosis not present

## 2022-05-21 DIAGNOSIS — Z9484 Stem cells transplant status: Secondary | ICD-10-CM | POA: Diagnosis not present

## 2022-05-21 DIAGNOSIS — K760 Fatty (change of) liver, not elsewhere classified: Secondary | ICD-10-CM | POA: Diagnosis not present

## 2022-05-21 DIAGNOSIS — F419 Anxiety disorder, unspecified: Secondary | ICD-10-CM | POA: Diagnosis not present

## 2022-05-21 DIAGNOSIS — E876 Hypokalemia: Secondary | ICD-10-CM | POA: Diagnosis not present

## 2022-05-21 DIAGNOSIS — E785 Hyperlipidemia, unspecified: Secondary | ICD-10-CM | POA: Diagnosis not present

## 2022-05-21 DIAGNOSIS — Z7952 Long term (current) use of systemic steroids: Secondary | ICD-10-CM | POA: Diagnosis not present

## 2022-05-21 DIAGNOSIS — D630 Anemia in neoplastic disease: Secondary | ICD-10-CM | POA: Diagnosis not present

## 2022-05-21 DIAGNOSIS — E861 Hypovolemia: Secondary | ICD-10-CM | POA: Diagnosis not present

## 2022-05-21 DIAGNOSIS — I10 Essential (primary) hypertension: Secondary | ICD-10-CM | POA: Diagnosis not present

## 2022-05-26 DIAGNOSIS — E785 Hyperlipidemia, unspecified: Secondary | ICD-10-CM | POA: Diagnosis not present

## 2022-05-26 DIAGNOSIS — K219 Gastro-esophageal reflux disease without esophagitis: Secondary | ICD-10-CM | POA: Diagnosis not present

## 2022-05-26 DIAGNOSIS — E876 Hypokalemia: Secondary | ICD-10-CM | POA: Diagnosis not present

## 2022-05-26 DIAGNOSIS — M1990 Unspecified osteoarthritis, unspecified site: Secondary | ICD-10-CM | POA: Diagnosis not present

## 2022-05-26 DIAGNOSIS — E861 Hypovolemia: Secondary | ICD-10-CM | POA: Diagnosis not present

## 2022-05-26 DIAGNOSIS — K760 Fatty (change of) liver, not elsewhere classified: Secondary | ICD-10-CM | POA: Diagnosis not present

## 2022-05-26 DIAGNOSIS — F3342 Major depressive disorder, recurrent, in full remission: Secondary | ICD-10-CM | POA: Diagnosis not present

## 2022-05-26 DIAGNOSIS — G8929 Other chronic pain: Secondary | ICD-10-CM | POA: Diagnosis not present

## 2022-05-26 DIAGNOSIS — C9000 Multiple myeloma not having achieved remission: Secondary | ICD-10-CM | POA: Diagnosis not present

## 2022-05-26 DIAGNOSIS — K449 Diaphragmatic hernia without obstruction or gangrene: Secondary | ICD-10-CM | POA: Diagnosis not present

## 2022-05-26 DIAGNOSIS — Z6829 Body mass index (BMI) 29.0-29.9, adult: Secondary | ICD-10-CM | POA: Diagnosis not present

## 2022-05-26 DIAGNOSIS — G250 Essential tremor: Secondary | ICD-10-CM | POA: Diagnosis not present

## 2022-05-26 DIAGNOSIS — I10 Essential (primary) hypertension: Secondary | ICD-10-CM | POA: Diagnosis not present

## 2022-05-26 DIAGNOSIS — F419 Anxiety disorder, unspecified: Secondary | ICD-10-CM | POA: Diagnosis not present

## 2022-05-26 DIAGNOSIS — M810 Age-related osteoporosis without current pathological fracture: Secondary | ICD-10-CM | POA: Diagnosis not present

## 2022-05-26 DIAGNOSIS — E663 Overweight: Secondary | ICD-10-CM | POA: Diagnosis not present

## 2022-05-26 DIAGNOSIS — R530 Neoplastic (malignant) related fatigue: Secondary | ICD-10-CM | POA: Diagnosis not present

## 2022-05-26 DIAGNOSIS — Z9484 Stem cells transplant status: Secondary | ICD-10-CM | POA: Diagnosis not present

## 2022-05-26 DIAGNOSIS — S065X0D Traumatic subdural hemorrhage without loss of consciousness, subsequent encounter: Secondary | ICD-10-CM | POA: Diagnosis not present

## 2022-05-26 DIAGNOSIS — Z79899 Other long term (current) drug therapy: Secondary | ICD-10-CM | POA: Diagnosis not present

## 2022-05-26 DIAGNOSIS — D630 Anemia in neoplastic disease: Secondary | ICD-10-CM | POA: Diagnosis not present

## 2022-05-26 DIAGNOSIS — Z7952 Long term (current) use of systemic steroids: Secondary | ICD-10-CM | POA: Diagnosis not present

## 2022-05-26 DIAGNOSIS — D696 Thrombocytopenia, unspecified: Secondary | ICD-10-CM | POA: Diagnosis not present

## 2022-05-27 DIAGNOSIS — G620 Drug-induced polyneuropathy: Secondary | ICD-10-CM | POA: Diagnosis not present

## 2022-05-27 DIAGNOSIS — D638 Anemia in other chronic diseases classified elsewhere: Secondary | ICD-10-CM | POA: Diagnosis not present

## 2022-05-27 DIAGNOSIS — I951 Orthostatic hypotension: Secondary | ICD-10-CM | POA: Diagnosis not present

## 2022-05-27 DIAGNOSIS — Z9484 Stem cells transplant status: Secondary | ICD-10-CM | POA: Diagnosis not present

## 2022-05-27 DIAGNOSIS — C9000 Multiple myeloma not having achieved remission: Secondary | ICD-10-CM | POA: Diagnosis not present

## 2022-05-27 DIAGNOSIS — G909 Disorder of the autonomic nervous system, unspecified: Secondary | ICD-10-CM | POA: Diagnosis not present

## 2022-05-28 DIAGNOSIS — E785 Hyperlipidemia, unspecified: Secondary | ICD-10-CM | POA: Diagnosis not present

## 2022-05-28 DIAGNOSIS — E876 Hypokalemia: Secondary | ICD-10-CM | POA: Diagnosis not present

## 2022-05-28 DIAGNOSIS — R530 Neoplastic (malignant) related fatigue: Secondary | ICD-10-CM | POA: Diagnosis not present

## 2022-05-28 DIAGNOSIS — M810 Age-related osteoporosis without current pathological fracture: Secondary | ICD-10-CM | POA: Diagnosis not present

## 2022-05-28 DIAGNOSIS — Z7952 Long term (current) use of systemic steroids: Secondary | ICD-10-CM | POA: Diagnosis not present

## 2022-05-28 DIAGNOSIS — E861 Hypovolemia: Secondary | ICD-10-CM | POA: Diagnosis not present

## 2022-05-28 DIAGNOSIS — S065X0D Traumatic subdural hemorrhage without loss of consciousness, subsequent encounter: Secondary | ICD-10-CM | POA: Diagnosis not present

## 2022-05-28 DIAGNOSIS — D696 Thrombocytopenia, unspecified: Secondary | ICD-10-CM | POA: Diagnosis not present

## 2022-05-28 DIAGNOSIS — F3342 Major depressive disorder, recurrent, in full remission: Secondary | ICD-10-CM | POA: Diagnosis not present

## 2022-05-28 DIAGNOSIS — K449 Diaphragmatic hernia without obstruction or gangrene: Secondary | ICD-10-CM | POA: Diagnosis not present

## 2022-05-28 DIAGNOSIS — E663 Overweight: Secondary | ICD-10-CM | POA: Diagnosis not present

## 2022-05-28 DIAGNOSIS — Z79899 Other long term (current) drug therapy: Secondary | ICD-10-CM | POA: Diagnosis not present

## 2022-05-28 DIAGNOSIS — K760 Fatty (change of) liver, not elsewhere classified: Secondary | ICD-10-CM | POA: Diagnosis not present

## 2022-05-28 DIAGNOSIS — F419 Anxiety disorder, unspecified: Secondary | ICD-10-CM | POA: Diagnosis not present

## 2022-05-28 DIAGNOSIS — Z9484 Stem cells transplant status: Secondary | ICD-10-CM | POA: Diagnosis not present

## 2022-05-28 DIAGNOSIS — M1990 Unspecified osteoarthritis, unspecified site: Secondary | ICD-10-CM | POA: Diagnosis not present

## 2022-05-28 DIAGNOSIS — G8929 Other chronic pain: Secondary | ICD-10-CM | POA: Diagnosis not present

## 2022-05-28 DIAGNOSIS — D630 Anemia in neoplastic disease: Secondary | ICD-10-CM | POA: Diagnosis not present

## 2022-05-28 DIAGNOSIS — Z6829 Body mass index (BMI) 29.0-29.9, adult: Secondary | ICD-10-CM | POA: Diagnosis not present

## 2022-05-28 DIAGNOSIS — K219 Gastro-esophageal reflux disease without esophagitis: Secondary | ICD-10-CM | POA: Diagnosis not present

## 2022-05-28 DIAGNOSIS — C9000 Multiple myeloma not having achieved remission: Secondary | ICD-10-CM | POA: Diagnosis not present

## 2022-05-28 DIAGNOSIS — I10 Essential (primary) hypertension: Secondary | ICD-10-CM | POA: Diagnosis not present

## 2022-05-28 DIAGNOSIS — G250 Essential tremor: Secondary | ICD-10-CM | POA: Diagnosis not present

## 2022-05-30 DIAGNOSIS — R530 Neoplastic (malignant) related fatigue: Secondary | ICD-10-CM | POA: Diagnosis not present

## 2022-05-30 DIAGNOSIS — I9589 Other hypotension: Secondary | ICD-10-CM | POA: Diagnosis not present

## 2022-05-30 DIAGNOSIS — Z9484 Stem cells transplant status: Secondary | ICD-10-CM | POA: Diagnosis not present

## 2022-05-30 DIAGNOSIS — I10 Essential (primary) hypertension: Secondary | ICD-10-CM | POA: Diagnosis not present

## 2022-05-30 DIAGNOSIS — K760 Fatty (change of) liver, not elsewhere classified: Secondary | ICD-10-CM | POA: Diagnosis not present

## 2022-05-30 DIAGNOSIS — S065X0D Traumatic subdural hemorrhage without loss of consciousness, subsequent encounter: Secondary | ICD-10-CM | POA: Diagnosis not present

## 2022-05-30 DIAGNOSIS — E782 Mixed hyperlipidemia: Secondary | ICD-10-CM | POA: Diagnosis not present

## 2022-05-30 DIAGNOSIS — K219 Gastro-esophageal reflux disease without esophagitis: Secondary | ICD-10-CM | POA: Diagnosis not present

## 2022-05-30 DIAGNOSIS — C9000 Multiple myeloma not having achieved remission: Secondary | ICD-10-CM | POA: Diagnosis not present

## 2022-05-30 DIAGNOSIS — Z79899 Other long term (current) drug therapy: Secondary | ICD-10-CM | POA: Diagnosis not present

## 2022-05-30 DIAGNOSIS — K449 Diaphragmatic hernia without obstruction or gangrene: Secondary | ICD-10-CM | POA: Diagnosis not present

## 2022-05-30 DIAGNOSIS — M810 Age-related osteoporosis without current pathological fracture: Secondary | ICD-10-CM | POA: Diagnosis not present

## 2022-05-30 DIAGNOSIS — D539 Nutritional anemia, unspecified: Secondary | ICD-10-CM | POA: Diagnosis not present

## 2022-05-30 DIAGNOSIS — E876 Hypokalemia: Secondary | ICD-10-CM | POA: Diagnosis not present

## 2022-05-30 DIAGNOSIS — E861 Hypovolemia: Secondary | ICD-10-CM | POA: Diagnosis not present

## 2022-05-30 DIAGNOSIS — M1990 Unspecified osteoarthritis, unspecified site: Secondary | ICD-10-CM | POA: Diagnosis not present

## 2022-05-30 DIAGNOSIS — F419 Anxiety disorder, unspecified: Secondary | ICD-10-CM | POA: Diagnosis not present

## 2022-05-30 DIAGNOSIS — E785 Hyperlipidemia, unspecified: Secondary | ICD-10-CM | POA: Diagnosis not present

## 2022-05-30 DIAGNOSIS — D630 Anemia in neoplastic disease: Secondary | ICD-10-CM | POA: Diagnosis not present

## 2022-05-30 DIAGNOSIS — G8929 Other chronic pain: Secondary | ICD-10-CM | POA: Diagnosis not present

## 2022-05-30 DIAGNOSIS — C9001 Multiple myeloma in remission: Secondary | ICD-10-CM | POA: Diagnosis not present

## 2022-05-30 DIAGNOSIS — D696 Thrombocytopenia, unspecified: Secondary | ICD-10-CM | POA: Diagnosis not present

## 2022-05-30 DIAGNOSIS — G250 Essential tremor: Secondary | ICD-10-CM | POA: Diagnosis not present

## 2022-05-30 DIAGNOSIS — E663 Overweight: Secondary | ICD-10-CM | POA: Diagnosis not present

## 2022-05-30 DIAGNOSIS — Z7952 Long term (current) use of systemic steroids: Secondary | ICD-10-CM | POA: Diagnosis not present

## 2022-05-30 DIAGNOSIS — Z6829 Body mass index (BMI) 29.0-29.9, adult: Secondary | ICD-10-CM | POA: Diagnosis not present

## 2022-05-30 DIAGNOSIS — F3342 Major depressive disorder, recurrent, in full remission: Secondary | ICD-10-CM | POA: Diagnosis not present

## 2022-06-03 DIAGNOSIS — D638 Anemia in other chronic diseases classified elsewhere: Secondary | ICD-10-CM | POA: Diagnosis not present

## 2022-06-03 DIAGNOSIS — C9000 Multiple myeloma not having achieved remission: Secondary | ICD-10-CM | POA: Diagnosis not present

## 2022-06-03 DIAGNOSIS — I951 Orthostatic hypotension: Secondary | ICD-10-CM | POA: Diagnosis not present

## 2022-06-03 DIAGNOSIS — G620 Drug-induced polyneuropathy: Secondary | ICD-10-CM | POA: Diagnosis not present

## 2022-06-03 DIAGNOSIS — G909 Disorder of the autonomic nervous system, unspecified: Secondary | ICD-10-CM | POA: Diagnosis not present

## 2022-06-03 DIAGNOSIS — Z9484 Stem cells transplant status: Secondary | ICD-10-CM | POA: Diagnosis not present

## 2022-06-04 DIAGNOSIS — K449 Diaphragmatic hernia without obstruction or gangrene: Secondary | ICD-10-CM | POA: Diagnosis not present

## 2022-06-04 DIAGNOSIS — D630 Anemia in neoplastic disease: Secondary | ICD-10-CM | POA: Diagnosis not present

## 2022-06-04 DIAGNOSIS — I10 Essential (primary) hypertension: Secondary | ICD-10-CM | POA: Diagnosis not present

## 2022-06-04 DIAGNOSIS — Z9484 Stem cells transplant status: Secondary | ICD-10-CM | POA: Diagnosis not present

## 2022-06-04 DIAGNOSIS — E785 Hyperlipidemia, unspecified: Secondary | ICD-10-CM | POA: Diagnosis not present

## 2022-06-04 DIAGNOSIS — K760 Fatty (change of) liver, not elsewhere classified: Secondary | ICD-10-CM | POA: Diagnosis not present

## 2022-06-04 DIAGNOSIS — D696 Thrombocytopenia, unspecified: Secondary | ICD-10-CM | POA: Diagnosis not present

## 2022-06-04 DIAGNOSIS — E876 Hypokalemia: Secondary | ICD-10-CM | POA: Diagnosis not present

## 2022-06-04 DIAGNOSIS — F3342 Major depressive disorder, recurrent, in full remission: Secondary | ICD-10-CM | POA: Diagnosis not present

## 2022-06-04 DIAGNOSIS — Z79899 Other long term (current) drug therapy: Secondary | ICD-10-CM | POA: Diagnosis not present

## 2022-06-04 DIAGNOSIS — E663 Overweight: Secondary | ICD-10-CM | POA: Diagnosis not present

## 2022-06-04 DIAGNOSIS — Z6829 Body mass index (BMI) 29.0-29.9, adult: Secondary | ICD-10-CM | POA: Diagnosis not present

## 2022-06-04 DIAGNOSIS — R530 Neoplastic (malignant) related fatigue: Secondary | ICD-10-CM | POA: Diagnosis not present

## 2022-06-04 DIAGNOSIS — M810 Age-related osteoporosis without current pathological fracture: Secondary | ICD-10-CM | POA: Diagnosis not present

## 2022-06-04 DIAGNOSIS — M1990 Unspecified osteoarthritis, unspecified site: Secondary | ICD-10-CM | POA: Diagnosis not present

## 2022-06-04 DIAGNOSIS — S065X0D Traumatic subdural hemorrhage without loss of consciousness, subsequent encounter: Secondary | ICD-10-CM | POA: Diagnosis not present

## 2022-06-04 DIAGNOSIS — K219 Gastro-esophageal reflux disease without esophagitis: Secondary | ICD-10-CM | POA: Diagnosis not present

## 2022-06-04 DIAGNOSIS — C9000 Multiple myeloma not having achieved remission: Secondary | ICD-10-CM | POA: Diagnosis not present

## 2022-06-04 DIAGNOSIS — Z7952 Long term (current) use of systemic steroids: Secondary | ICD-10-CM | POA: Diagnosis not present

## 2022-06-04 DIAGNOSIS — G250 Essential tremor: Secondary | ICD-10-CM | POA: Diagnosis not present

## 2022-06-04 DIAGNOSIS — F419 Anxiety disorder, unspecified: Secondary | ICD-10-CM | POA: Diagnosis not present

## 2022-06-04 DIAGNOSIS — E861 Hypovolemia: Secondary | ICD-10-CM | POA: Diagnosis not present

## 2022-06-04 DIAGNOSIS — G8929 Other chronic pain: Secondary | ICD-10-CM | POA: Diagnosis not present

## 2022-06-06 DIAGNOSIS — K449 Diaphragmatic hernia without obstruction or gangrene: Secondary | ICD-10-CM | POA: Diagnosis not present

## 2022-06-06 DIAGNOSIS — S065X0D Traumatic subdural hemorrhage without loss of consciousness, subsequent encounter: Secondary | ICD-10-CM | POA: Diagnosis not present

## 2022-06-06 DIAGNOSIS — Z79899 Other long term (current) drug therapy: Secondary | ICD-10-CM | POA: Diagnosis not present

## 2022-06-06 DIAGNOSIS — F419 Anxiety disorder, unspecified: Secondary | ICD-10-CM | POA: Diagnosis not present

## 2022-06-06 DIAGNOSIS — D630 Anemia in neoplastic disease: Secondary | ICD-10-CM | POA: Diagnosis not present

## 2022-06-06 DIAGNOSIS — K760 Fatty (change of) liver, not elsewhere classified: Secondary | ICD-10-CM | POA: Diagnosis not present

## 2022-06-06 DIAGNOSIS — E663 Overweight: Secondary | ICD-10-CM | POA: Diagnosis not present

## 2022-06-06 DIAGNOSIS — D696 Thrombocytopenia, unspecified: Secondary | ICD-10-CM | POA: Diagnosis not present

## 2022-06-06 DIAGNOSIS — F3342 Major depressive disorder, recurrent, in full remission: Secondary | ICD-10-CM | POA: Diagnosis not present

## 2022-06-06 DIAGNOSIS — R530 Neoplastic (malignant) related fatigue: Secondary | ICD-10-CM | POA: Diagnosis not present

## 2022-06-06 DIAGNOSIS — M810 Age-related osteoporosis without current pathological fracture: Secondary | ICD-10-CM | POA: Diagnosis not present

## 2022-06-06 DIAGNOSIS — C9000 Multiple myeloma not having achieved remission: Secondary | ICD-10-CM | POA: Diagnosis not present

## 2022-06-06 DIAGNOSIS — Z6829 Body mass index (BMI) 29.0-29.9, adult: Secondary | ICD-10-CM | POA: Diagnosis not present

## 2022-06-06 DIAGNOSIS — E861 Hypovolemia: Secondary | ICD-10-CM | POA: Diagnosis not present

## 2022-06-06 DIAGNOSIS — G8929 Other chronic pain: Secondary | ICD-10-CM | POA: Diagnosis not present

## 2022-06-06 DIAGNOSIS — I10 Essential (primary) hypertension: Secondary | ICD-10-CM | POA: Diagnosis not present

## 2022-06-06 DIAGNOSIS — Z7952 Long term (current) use of systemic steroids: Secondary | ICD-10-CM | POA: Diagnosis not present

## 2022-06-06 DIAGNOSIS — E876 Hypokalemia: Secondary | ICD-10-CM | POA: Diagnosis not present

## 2022-06-06 DIAGNOSIS — M1990 Unspecified osteoarthritis, unspecified site: Secondary | ICD-10-CM | POA: Diagnosis not present

## 2022-06-06 DIAGNOSIS — E785 Hyperlipidemia, unspecified: Secondary | ICD-10-CM | POA: Diagnosis not present

## 2022-06-06 DIAGNOSIS — Z9484 Stem cells transplant status: Secondary | ICD-10-CM | POA: Diagnosis not present

## 2022-06-06 DIAGNOSIS — K219 Gastro-esophageal reflux disease without esophagitis: Secondary | ICD-10-CM | POA: Diagnosis not present

## 2022-06-06 DIAGNOSIS — G250 Essential tremor: Secondary | ICD-10-CM | POA: Diagnosis not present

## 2022-06-11 DIAGNOSIS — E876 Hypokalemia: Secondary | ICD-10-CM | POA: Diagnosis not present

## 2022-06-11 DIAGNOSIS — G250 Essential tremor: Secondary | ICD-10-CM | POA: Diagnosis not present

## 2022-06-11 DIAGNOSIS — Z6829 Body mass index (BMI) 29.0-29.9, adult: Secondary | ICD-10-CM | POA: Diagnosis not present

## 2022-06-11 DIAGNOSIS — Z9484 Stem cells transplant status: Secondary | ICD-10-CM | POA: Diagnosis not present

## 2022-06-11 DIAGNOSIS — K760 Fatty (change of) liver, not elsewhere classified: Secondary | ICD-10-CM | POA: Diagnosis not present

## 2022-06-11 DIAGNOSIS — M1990 Unspecified osteoarthritis, unspecified site: Secondary | ICD-10-CM | POA: Diagnosis not present

## 2022-06-11 DIAGNOSIS — F419 Anxiety disorder, unspecified: Secondary | ICD-10-CM | POA: Diagnosis not present

## 2022-06-11 DIAGNOSIS — M810 Age-related osteoporosis without current pathological fracture: Secondary | ICD-10-CM | POA: Diagnosis not present

## 2022-06-11 DIAGNOSIS — E861 Hypovolemia: Secondary | ICD-10-CM | POA: Diagnosis not present

## 2022-06-11 DIAGNOSIS — E663 Overweight: Secondary | ICD-10-CM | POA: Diagnosis not present

## 2022-06-11 DIAGNOSIS — F3342 Major depressive disorder, recurrent, in full remission: Secondary | ICD-10-CM | POA: Diagnosis not present

## 2022-06-11 DIAGNOSIS — D696 Thrombocytopenia, unspecified: Secondary | ICD-10-CM | POA: Diagnosis not present

## 2022-06-11 DIAGNOSIS — I10 Essential (primary) hypertension: Secondary | ICD-10-CM | POA: Diagnosis not present

## 2022-06-11 DIAGNOSIS — R530 Neoplastic (malignant) related fatigue: Secondary | ICD-10-CM | POA: Diagnosis not present

## 2022-06-11 DIAGNOSIS — E785 Hyperlipidemia, unspecified: Secondary | ICD-10-CM | POA: Diagnosis not present

## 2022-06-11 DIAGNOSIS — Z7952 Long term (current) use of systemic steroids: Secondary | ICD-10-CM | POA: Diagnosis not present

## 2022-06-11 DIAGNOSIS — S065X0D Traumatic subdural hemorrhage without loss of consciousness, subsequent encounter: Secondary | ICD-10-CM | POA: Diagnosis not present

## 2022-06-11 DIAGNOSIS — D630 Anemia in neoplastic disease: Secondary | ICD-10-CM | POA: Diagnosis not present

## 2022-06-11 DIAGNOSIS — C9000 Multiple myeloma not having achieved remission: Secondary | ICD-10-CM | POA: Diagnosis not present

## 2022-06-11 DIAGNOSIS — K219 Gastro-esophageal reflux disease without esophagitis: Secondary | ICD-10-CM | POA: Diagnosis not present

## 2022-06-11 DIAGNOSIS — K449 Diaphragmatic hernia without obstruction or gangrene: Secondary | ICD-10-CM | POA: Diagnosis not present

## 2022-06-11 DIAGNOSIS — G8929 Other chronic pain: Secondary | ICD-10-CM | POA: Diagnosis not present

## 2022-06-11 DIAGNOSIS — Z79899 Other long term (current) drug therapy: Secondary | ICD-10-CM | POA: Diagnosis not present

## 2022-06-13 DIAGNOSIS — D696 Thrombocytopenia, unspecified: Secondary | ICD-10-CM | POA: Diagnosis not present

## 2022-06-13 DIAGNOSIS — D708 Other neutropenia: Secondary | ICD-10-CM | POA: Diagnosis not present

## 2022-06-13 DIAGNOSIS — G909 Disorder of the autonomic nervous system, unspecified: Secondary | ICD-10-CM | POA: Diagnosis not present

## 2022-06-13 DIAGNOSIS — C9000 Multiple myeloma not having achieved remission: Secondary | ICD-10-CM | POA: Diagnosis not present

## 2022-06-13 DIAGNOSIS — D638 Anemia in other chronic diseases classified elsewhere: Secondary | ICD-10-CM | POA: Diagnosis not present

## 2022-06-13 DIAGNOSIS — Z9484 Stem cells transplant status: Secondary | ICD-10-CM | POA: Diagnosis not present

## 2022-06-13 DIAGNOSIS — R69 Illness, unspecified: Secondary | ICD-10-CM | POA: Diagnosis not present

## 2022-06-13 DIAGNOSIS — I10 Essential (primary) hypertension: Secondary | ICD-10-CM | POA: Diagnosis not present

## 2022-06-17 DIAGNOSIS — C9000 Multiple myeloma not having achieved remission: Secondary | ICD-10-CM | POA: Diagnosis not present

## 2022-06-17 DIAGNOSIS — D638 Anemia in other chronic diseases classified elsewhere: Secondary | ICD-10-CM | POA: Diagnosis not present

## 2022-06-18 DIAGNOSIS — I10 Essential (primary) hypertension: Secondary | ICD-10-CM | POA: Diagnosis not present

## 2022-06-18 DIAGNOSIS — M1990 Unspecified osteoarthritis, unspecified site: Secondary | ICD-10-CM | POA: Diagnosis not present

## 2022-06-18 DIAGNOSIS — M810 Age-related osteoporosis without current pathological fracture: Secondary | ICD-10-CM | POA: Diagnosis not present

## 2022-06-18 DIAGNOSIS — E785 Hyperlipidemia, unspecified: Secondary | ICD-10-CM | POA: Diagnosis not present

## 2022-06-18 DIAGNOSIS — E663 Overweight: Secondary | ICD-10-CM | POA: Diagnosis not present

## 2022-06-18 DIAGNOSIS — Z79899 Other long term (current) drug therapy: Secondary | ICD-10-CM | POA: Diagnosis not present

## 2022-06-18 DIAGNOSIS — F3342 Major depressive disorder, recurrent, in full remission: Secondary | ICD-10-CM | POA: Diagnosis not present

## 2022-06-18 DIAGNOSIS — K219 Gastro-esophageal reflux disease without esophagitis: Secondary | ICD-10-CM | POA: Diagnosis not present

## 2022-06-18 DIAGNOSIS — K449 Diaphragmatic hernia without obstruction or gangrene: Secondary | ICD-10-CM | POA: Diagnosis not present

## 2022-06-18 DIAGNOSIS — D696 Thrombocytopenia, unspecified: Secondary | ICD-10-CM | POA: Diagnosis not present

## 2022-06-18 DIAGNOSIS — F419 Anxiety disorder, unspecified: Secondary | ICD-10-CM | POA: Diagnosis not present

## 2022-06-18 DIAGNOSIS — D630 Anemia in neoplastic disease: Secondary | ICD-10-CM | POA: Diagnosis not present

## 2022-06-18 DIAGNOSIS — Z7952 Long term (current) use of systemic steroids: Secondary | ICD-10-CM | POA: Diagnosis not present

## 2022-06-18 DIAGNOSIS — S065X0D Traumatic subdural hemorrhage without loss of consciousness, subsequent encounter: Secondary | ICD-10-CM | POA: Diagnosis not present

## 2022-06-18 DIAGNOSIS — E876 Hypokalemia: Secondary | ICD-10-CM | POA: Diagnosis not present

## 2022-06-18 DIAGNOSIS — C9000 Multiple myeloma not having achieved remission: Secondary | ICD-10-CM | POA: Diagnosis not present

## 2022-06-18 DIAGNOSIS — G8929 Other chronic pain: Secondary | ICD-10-CM | POA: Diagnosis not present

## 2022-06-18 DIAGNOSIS — Z6829 Body mass index (BMI) 29.0-29.9, adult: Secondary | ICD-10-CM | POA: Diagnosis not present

## 2022-06-18 DIAGNOSIS — R530 Neoplastic (malignant) related fatigue: Secondary | ICD-10-CM | POA: Diagnosis not present

## 2022-06-18 DIAGNOSIS — Z9484 Stem cells transplant status: Secondary | ICD-10-CM | POA: Diagnosis not present

## 2022-06-18 DIAGNOSIS — E861 Hypovolemia: Secondary | ICD-10-CM | POA: Diagnosis not present

## 2022-06-18 DIAGNOSIS — K760 Fatty (change of) liver, not elsewhere classified: Secondary | ICD-10-CM | POA: Diagnosis not present

## 2022-06-18 DIAGNOSIS — G250 Essential tremor: Secondary | ICD-10-CM | POA: Diagnosis not present

## 2022-06-20 DIAGNOSIS — I10 Essential (primary) hypertension: Secondary | ICD-10-CM | POA: Diagnosis not present

## 2022-06-20 DIAGNOSIS — K219 Gastro-esophageal reflux disease without esophagitis: Secondary | ICD-10-CM | POA: Diagnosis not present

## 2022-06-20 DIAGNOSIS — R112 Nausea with vomiting, unspecified: Secondary | ICD-10-CM | POA: Diagnosis not present

## 2022-06-24 DIAGNOSIS — D696 Thrombocytopenia, unspecified: Secondary | ICD-10-CM | POA: Diagnosis not present

## 2022-06-24 DIAGNOSIS — G8929 Other chronic pain: Secondary | ICD-10-CM | POA: Diagnosis not present

## 2022-06-24 DIAGNOSIS — R530 Neoplastic (malignant) related fatigue: Secondary | ICD-10-CM | POA: Diagnosis not present

## 2022-06-24 DIAGNOSIS — K449 Diaphragmatic hernia without obstruction or gangrene: Secondary | ICD-10-CM | POA: Diagnosis not present

## 2022-06-24 DIAGNOSIS — Z7952 Long term (current) use of systemic steroids: Secondary | ICD-10-CM | POA: Diagnosis not present

## 2022-06-24 DIAGNOSIS — F3342 Major depressive disorder, recurrent, in full remission: Secondary | ICD-10-CM | POA: Diagnosis not present

## 2022-06-24 DIAGNOSIS — D630 Anemia in neoplastic disease: Secondary | ICD-10-CM | POA: Diagnosis not present

## 2022-06-24 DIAGNOSIS — Z6829 Body mass index (BMI) 29.0-29.9, adult: Secondary | ICD-10-CM | POA: Diagnosis not present

## 2022-06-24 DIAGNOSIS — E861 Hypovolemia: Secondary | ICD-10-CM | POA: Diagnosis not present

## 2022-06-24 DIAGNOSIS — C9000 Multiple myeloma not having achieved remission: Secondary | ICD-10-CM | POA: Diagnosis not present

## 2022-06-24 DIAGNOSIS — M1990 Unspecified osteoarthritis, unspecified site: Secondary | ICD-10-CM | POA: Diagnosis not present

## 2022-06-24 DIAGNOSIS — E876 Hypokalemia: Secondary | ICD-10-CM | POA: Diagnosis not present

## 2022-06-24 DIAGNOSIS — K219 Gastro-esophageal reflux disease without esophagitis: Secondary | ICD-10-CM | POA: Diagnosis not present

## 2022-06-24 DIAGNOSIS — I10 Essential (primary) hypertension: Secondary | ICD-10-CM | POA: Diagnosis not present

## 2022-06-24 DIAGNOSIS — F419 Anxiety disorder, unspecified: Secondary | ICD-10-CM | POA: Diagnosis not present

## 2022-06-24 DIAGNOSIS — K760 Fatty (change of) liver, not elsewhere classified: Secondary | ICD-10-CM | POA: Diagnosis not present

## 2022-06-24 DIAGNOSIS — E663 Overweight: Secondary | ICD-10-CM | POA: Diagnosis not present

## 2022-06-24 DIAGNOSIS — Z9484 Stem cells transplant status: Secondary | ICD-10-CM | POA: Diagnosis not present

## 2022-06-24 DIAGNOSIS — S065X0D Traumatic subdural hemorrhage without loss of consciousness, subsequent encounter: Secondary | ICD-10-CM | POA: Diagnosis not present

## 2022-06-24 DIAGNOSIS — M810 Age-related osteoporosis without current pathological fracture: Secondary | ICD-10-CM | POA: Diagnosis not present

## 2022-06-24 DIAGNOSIS — E785 Hyperlipidemia, unspecified: Secondary | ICD-10-CM | POA: Diagnosis not present

## 2022-06-24 DIAGNOSIS — Z79899 Other long term (current) drug therapy: Secondary | ICD-10-CM | POA: Diagnosis not present

## 2022-06-24 DIAGNOSIS — G250 Essential tremor: Secondary | ICD-10-CM | POA: Diagnosis not present

## 2022-06-25 DIAGNOSIS — E876 Hypokalemia: Secondary | ICD-10-CM | POA: Diagnosis not present

## 2022-06-25 DIAGNOSIS — E785 Hyperlipidemia, unspecified: Secondary | ICD-10-CM | POA: Diagnosis not present

## 2022-06-25 DIAGNOSIS — D630 Anemia in neoplastic disease: Secondary | ICD-10-CM | POA: Diagnosis not present

## 2022-06-25 DIAGNOSIS — R530 Neoplastic (malignant) related fatigue: Secondary | ICD-10-CM | POA: Diagnosis not present

## 2022-06-25 DIAGNOSIS — E861 Hypovolemia: Secondary | ICD-10-CM | POA: Diagnosis not present

## 2022-06-25 DIAGNOSIS — Z7952 Long term (current) use of systemic steroids: Secondary | ICD-10-CM | POA: Diagnosis not present

## 2022-06-25 DIAGNOSIS — F419 Anxiety disorder, unspecified: Secondary | ICD-10-CM | POA: Diagnosis not present

## 2022-06-25 DIAGNOSIS — K760 Fatty (change of) liver, not elsewhere classified: Secondary | ICD-10-CM | POA: Diagnosis not present

## 2022-06-25 DIAGNOSIS — Z79899 Other long term (current) drug therapy: Secondary | ICD-10-CM | POA: Diagnosis not present

## 2022-06-25 DIAGNOSIS — G8929 Other chronic pain: Secondary | ICD-10-CM | POA: Diagnosis not present

## 2022-06-25 DIAGNOSIS — C9000 Multiple myeloma not having achieved remission: Secondary | ICD-10-CM | POA: Diagnosis not present

## 2022-06-25 DIAGNOSIS — M810 Age-related osteoporosis without current pathological fracture: Secondary | ICD-10-CM | POA: Diagnosis not present

## 2022-06-25 DIAGNOSIS — F3342 Major depressive disorder, recurrent, in full remission: Secondary | ICD-10-CM | POA: Diagnosis not present

## 2022-06-25 DIAGNOSIS — Z6829 Body mass index (BMI) 29.0-29.9, adult: Secondary | ICD-10-CM | POA: Diagnosis not present

## 2022-06-25 DIAGNOSIS — Z9484 Stem cells transplant status: Secondary | ICD-10-CM | POA: Diagnosis not present

## 2022-06-25 DIAGNOSIS — S065X0D Traumatic subdural hemorrhage without loss of consciousness, subsequent encounter: Secondary | ICD-10-CM | POA: Diagnosis not present

## 2022-06-25 DIAGNOSIS — M1990 Unspecified osteoarthritis, unspecified site: Secondary | ICD-10-CM | POA: Diagnosis not present

## 2022-06-25 DIAGNOSIS — K219 Gastro-esophageal reflux disease without esophagitis: Secondary | ICD-10-CM | POA: Diagnosis not present

## 2022-06-25 DIAGNOSIS — D696 Thrombocytopenia, unspecified: Secondary | ICD-10-CM | POA: Diagnosis not present

## 2022-06-25 DIAGNOSIS — K449 Diaphragmatic hernia without obstruction or gangrene: Secondary | ICD-10-CM | POA: Diagnosis not present

## 2022-06-25 DIAGNOSIS — E663 Overweight: Secondary | ICD-10-CM | POA: Diagnosis not present

## 2022-06-25 DIAGNOSIS — G250 Essential tremor: Secondary | ICD-10-CM | POA: Diagnosis not present

## 2022-06-25 DIAGNOSIS — I10 Essential (primary) hypertension: Secondary | ICD-10-CM | POA: Diagnosis not present

## 2022-06-26 DIAGNOSIS — K3189 Other diseases of stomach and duodenum: Secondary | ICD-10-CM | POA: Diagnosis not present

## 2022-06-26 DIAGNOSIS — K2289 Other specified disease of esophagus: Secondary | ICD-10-CM | POA: Diagnosis not present

## 2022-06-26 DIAGNOSIS — K259 Gastric ulcer, unspecified as acute or chronic, without hemorrhage or perforation: Secondary | ICD-10-CM | POA: Diagnosis not present

## 2022-06-26 DIAGNOSIS — K219 Gastro-esophageal reflux disease without esophagitis: Secondary | ICD-10-CM | POA: Diagnosis not present

## 2022-06-26 DIAGNOSIS — B3781 Candidal esophagitis: Secondary | ICD-10-CM | POA: Diagnosis not present

## 2022-07-02 DIAGNOSIS — Z9484 Stem cells transplant status: Secondary | ICD-10-CM | POA: Diagnosis not present

## 2022-07-02 DIAGNOSIS — G620 Drug-induced polyneuropathy: Secondary | ICD-10-CM | POA: Diagnosis not present

## 2022-07-02 DIAGNOSIS — C9000 Multiple myeloma not having achieved remission: Secondary | ICD-10-CM | POA: Diagnosis not present

## 2022-07-02 DIAGNOSIS — I951 Orthostatic hypotension: Secondary | ICD-10-CM | POA: Diagnosis not present

## 2022-07-02 DIAGNOSIS — D638 Anemia in other chronic diseases classified elsewhere: Secondary | ICD-10-CM | POA: Diagnosis not present

## 2022-07-02 DIAGNOSIS — G909 Disorder of the autonomic nervous system, unspecified: Secondary | ICD-10-CM | POA: Diagnosis not present

## 2022-07-04 DIAGNOSIS — F3342 Major depressive disorder, recurrent, in full remission: Secondary | ICD-10-CM | POA: Diagnosis not present

## 2022-07-04 DIAGNOSIS — M810 Age-related osteoporosis without current pathological fracture: Secondary | ICD-10-CM | POA: Diagnosis not present

## 2022-07-04 DIAGNOSIS — M1990 Unspecified osteoarthritis, unspecified site: Secondary | ICD-10-CM | POA: Diagnosis not present

## 2022-07-04 DIAGNOSIS — K760 Fatty (change of) liver, not elsewhere classified: Secondary | ICD-10-CM | POA: Diagnosis not present

## 2022-07-04 DIAGNOSIS — Z9484 Stem cells transplant status: Secondary | ICD-10-CM | POA: Diagnosis not present

## 2022-07-04 DIAGNOSIS — Z7952 Long term (current) use of systemic steroids: Secondary | ICD-10-CM | POA: Diagnosis not present

## 2022-07-04 DIAGNOSIS — F419 Anxiety disorder, unspecified: Secondary | ICD-10-CM | POA: Diagnosis not present

## 2022-07-04 DIAGNOSIS — S065X0D Traumatic subdural hemorrhage without loss of consciousness, subsequent encounter: Secondary | ICD-10-CM | POA: Diagnosis not present

## 2022-07-04 DIAGNOSIS — K449 Diaphragmatic hernia without obstruction or gangrene: Secondary | ICD-10-CM | POA: Diagnosis not present

## 2022-07-04 DIAGNOSIS — E861 Hypovolemia: Secondary | ICD-10-CM | POA: Diagnosis not present

## 2022-07-04 DIAGNOSIS — D630 Anemia in neoplastic disease: Secondary | ICD-10-CM | POA: Diagnosis not present

## 2022-07-04 DIAGNOSIS — R530 Neoplastic (malignant) related fatigue: Secondary | ICD-10-CM | POA: Diagnosis not present

## 2022-07-04 DIAGNOSIS — E876 Hypokalemia: Secondary | ICD-10-CM | POA: Diagnosis not present

## 2022-07-04 DIAGNOSIS — K219 Gastro-esophageal reflux disease without esophagitis: Secondary | ICD-10-CM | POA: Diagnosis not present

## 2022-07-04 DIAGNOSIS — G250 Essential tremor: Secondary | ICD-10-CM | POA: Diagnosis not present

## 2022-07-04 DIAGNOSIS — C9000 Multiple myeloma not having achieved remission: Secondary | ICD-10-CM | POA: Diagnosis not present

## 2022-07-04 DIAGNOSIS — E785 Hyperlipidemia, unspecified: Secondary | ICD-10-CM | POA: Diagnosis not present

## 2022-07-04 DIAGNOSIS — I10 Essential (primary) hypertension: Secondary | ICD-10-CM | POA: Diagnosis not present

## 2022-07-04 DIAGNOSIS — Z6829 Body mass index (BMI) 29.0-29.9, adult: Secondary | ICD-10-CM | POA: Diagnosis not present

## 2022-07-04 DIAGNOSIS — D696 Thrombocytopenia, unspecified: Secondary | ICD-10-CM | POA: Diagnosis not present

## 2022-07-04 DIAGNOSIS — G8929 Other chronic pain: Secondary | ICD-10-CM | POA: Diagnosis not present

## 2022-07-04 DIAGNOSIS — E663 Overweight: Secondary | ICD-10-CM | POA: Diagnosis not present

## 2022-07-04 DIAGNOSIS — Z79899 Other long term (current) drug therapy: Secondary | ICD-10-CM | POA: Diagnosis not present

## 2022-07-08 DIAGNOSIS — R530 Neoplastic (malignant) related fatigue: Secondary | ICD-10-CM | POA: Diagnosis not present

## 2022-07-08 DIAGNOSIS — D696 Thrombocytopenia, unspecified: Secondary | ICD-10-CM | POA: Diagnosis not present

## 2022-07-08 DIAGNOSIS — K449 Diaphragmatic hernia without obstruction or gangrene: Secondary | ICD-10-CM | POA: Diagnosis not present

## 2022-07-08 DIAGNOSIS — Z6829 Body mass index (BMI) 29.0-29.9, adult: Secondary | ICD-10-CM | POA: Diagnosis not present

## 2022-07-08 DIAGNOSIS — K760 Fatty (change of) liver, not elsewhere classified: Secondary | ICD-10-CM | POA: Diagnosis not present

## 2022-07-08 DIAGNOSIS — G250 Essential tremor: Secondary | ICD-10-CM | POA: Diagnosis not present

## 2022-07-08 DIAGNOSIS — Z79899 Other long term (current) drug therapy: Secondary | ICD-10-CM | POA: Diagnosis not present

## 2022-07-08 DIAGNOSIS — E663 Overweight: Secondary | ICD-10-CM | POA: Diagnosis not present

## 2022-07-08 DIAGNOSIS — S065X0D Traumatic subdural hemorrhage without loss of consciousness, subsequent encounter: Secondary | ICD-10-CM | POA: Diagnosis not present

## 2022-07-08 DIAGNOSIS — E785 Hyperlipidemia, unspecified: Secondary | ICD-10-CM | POA: Diagnosis not present

## 2022-07-08 DIAGNOSIS — M810 Age-related osteoporosis without current pathological fracture: Secondary | ICD-10-CM | POA: Diagnosis not present

## 2022-07-08 DIAGNOSIS — E861 Hypovolemia: Secondary | ICD-10-CM | POA: Diagnosis not present

## 2022-07-08 DIAGNOSIS — D630 Anemia in neoplastic disease: Secondary | ICD-10-CM | POA: Diagnosis not present

## 2022-07-08 DIAGNOSIS — G8929 Other chronic pain: Secondary | ICD-10-CM | POA: Diagnosis not present

## 2022-07-08 DIAGNOSIS — K219 Gastro-esophageal reflux disease without esophagitis: Secondary | ICD-10-CM | POA: Diagnosis not present

## 2022-07-08 DIAGNOSIS — I10 Essential (primary) hypertension: Secondary | ICD-10-CM | POA: Diagnosis not present

## 2022-07-08 DIAGNOSIS — C9000 Multiple myeloma not having achieved remission: Secondary | ICD-10-CM | POA: Diagnosis not present

## 2022-07-08 DIAGNOSIS — F419 Anxiety disorder, unspecified: Secondary | ICD-10-CM | POA: Diagnosis not present

## 2022-07-08 DIAGNOSIS — E876 Hypokalemia: Secondary | ICD-10-CM | POA: Diagnosis not present

## 2022-07-08 DIAGNOSIS — Z9484 Stem cells transplant status: Secondary | ICD-10-CM | POA: Diagnosis not present

## 2022-07-08 DIAGNOSIS — Z7952 Long term (current) use of systemic steroids: Secondary | ICD-10-CM | POA: Diagnosis not present

## 2022-07-08 DIAGNOSIS — M1990 Unspecified osteoarthritis, unspecified site: Secondary | ICD-10-CM | POA: Diagnosis not present

## 2022-07-08 DIAGNOSIS — F3342 Major depressive disorder, recurrent, in full remission: Secondary | ICD-10-CM | POA: Diagnosis not present

## 2022-07-10 DIAGNOSIS — D638 Anemia in other chronic diseases classified elsewhere: Secondary | ICD-10-CM | POA: Diagnosis not present

## 2022-07-10 DIAGNOSIS — Z9484 Stem cells transplant status: Secondary | ICD-10-CM | POA: Diagnosis not present

## 2022-07-10 DIAGNOSIS — D539 Nutritional anemia, unspecified: Secondary | ICD-10-CM | POA: Diagnosis not present

## 2022-07-10 DIAGNOSIS — R69 Illness, unspecified: Secondary | ICD-10-CM | POA: Diagnosis not present

## 2022-07-10 DIAGNOSIS — C9000 Multiple myeloma not having achieved remission: Secondary | ICD-10-CM | POA: Diagnosis not present

## 2022-07-10 DIAGNOSIS — D708 Other neutropenia: Secondary | ICD-10-CM | POA: Diagnosis not present

## 2022-07-10 DIAGNOSIS — Z5112 Encounter for antineoplastic immunotherapy: Secondary | ICD-10-CM | POA: Diagnosis not present

## 2022-07-10 DIAGNOSIS — D759 Disease of blood and blood-forming organs, unspecified: Secondary | ICD-10-CM | POA: Diagnosis not present

## 2022-07-10 DIAGNOSIS — D696 Thrombocytopenia, unspecified: Secondary | ICD-10-CM | POA: Diagnosis not present

## 2022-07-18 DIAGNOSIS — C9000 Multiple myeloma not having achieved remission: Secondary | ICD-10-CM | POA: Diagnosis not present

## 2022-07-22 DIAGNOSIS — R530 Neoplastic (malignant) related fatigue: Secondary | ICD-10-CM | POA: Diagnosis not present

## 2022-07-22 DIAGNOSIS — G250 Essential tremor: Secondary | ICD-10-CM | POA: Diagnosis not present

## 2022-07-22 DIAGNOSIS — E663 Overweight: Secondary | ICD-10-CM | POA: Diagnosis not present

## 2022-07-22 DIAGNOSIS — E785 Hyperlipidemia, unspecified: Secondary | ICD-10-CM | POA: Diagnosis not present

## 2022-07-22 DIAGNOSIS — M810 Age-related osteoporosis without current pathological fracture: Secondary | ICD-10-CM | POA: Diagnosis not present

## 2022-07-22 DIAGNOSIS — D689 Coagulation defect, unspecified: Secondary | ICD-10-CM | POA: Diagnosis not present

## 2022-07-22 DIAGNOSIS — Z7952 Long term (current) use of systemic steroids: Secondary | ICD-10-CM | POA: Diagnosis not present

## 2022-07-22 DIAGNOSIS — K760 Fatty (change of) liver, not elsewhere classified: Secondary | ICD-10-CM | POA: Diagnosis not present

## 2022-07-22 DIAGNOSIS — E876 Hypokalemia: Secondary | ICD-10-CM | POA: Diagnosis not present

## 2022-07-22 DIAGNOSIS — K219 Gastro-esophageal reflux disease without esophagitis: Secondary | ICD-10-CM | POA: Diagnosis not present

## 2022-07-22 DIAGNOSIS — I10 Essential (primary) hypertension: Secondary | ICD-10-CM | POA: Diagnosis not present

## 2022-07-22 DIAGNOSIS — R296 Repeated falls: Secondary | ICD-10-CM | POA: Diagnosis not present

## 2022-07-22 DIAGNOSIS — D630 Anemia in neoplastic disease: Secondary | ICD-10-CM | POA: Diagnosis not present

## 2022-07-22 DIAGNOSIS — C9000 Multiple myeloma not having achieved remission: Secondary | ICD-10-CM | POA: Diagnosis not present

## 2022-07-22 DIAGNOSIS — M1990 Unspecified osteoarthritis, unspecified site: Secondary | ICD-10-CM | POA: Diagnosis not present

## 2022-07-22 DIAGNOSIS — Z79899 Other long term (current) drug therapy: Secondary | ICD-10-CM | POA: Diagnosis not present

## 2022-07-22 DIAGNOSIS — Z87891 Personal history of nicotine dependence: Secondary | ICD-10-CM | POA: Diagnosis not present

## 2022-07-22 DIAGNOSIS — S065X0D Traumatic subdural hemorrhage without loss of consciousness, subsequent encounter: Secondary | ICD-10-CM | POA: Diagnosis not present

## 2022-07-22 DIAGNOSIS — Z9181 History of falling: Secondary | ICD-10-CM | POA: Diagnosis not present

## 2022-07-22 DIAGNOSIS — F3342 Major depressive disorder, recurrent, in full remission: Secondary | ICD-10-CM | POA: Diagnosis not present

## 2022-07-22 DIAGNOSIS — F419 Anxiety disorder, unspecified: Secondary | ICD-10-CM | POA: Diagnosis not present

## 2022-07-22 DIAGNOSIS — Z6829 Body mass index (BMI) 29.0-29.9, adult: Secondary | ICD-10-CM | POA: Diagnosis not present

## 2022-07-22 DIAGNOSIS — Z9484 Stem cells transplant status: Secondary | ICD-10-CM | POA: Diagnosis not present

## 2022-07-22 DIAGNOSIS — K449 Diaphragmatic hernia without obstruction or gangrene: Secondary | ICD-10-CM | POA: Diagnosis not present

## 2022-07-25 DIAGNOSIS — M810 Age-related osteoporosis without current pathological fracture: Secondary | ICD-10-CM | POA: Diagnosis not present

## 2022-07-25 DIAGNOSIS — K760 Fatty (change of) liver, not elsewhere classified: Secondary | ICD-10-CM | POA: Diagnosis not present

## 2022-07-25 DIAGNOSIS — K449 Diaphragmatic hernia without obstruction or gangrene: Secondary | ICD-10-CM | POA: Diagnosis not present

## 2022-07-25 DIAGNOSIS — Z6829 Body mass index (BMI) 29.0-29.9, adult: Secondary | ICD-10-CM | POA: Diagnosis not present

## 2022-07-25 DIAGNOSIS — R296 Repeated falls: Secondary | ICD-10-CM | POA: Diagnosis not present

## 2022-07-25 DIAGNOSIS — R530 Neoplastic (malignant) related fatigue: Secondary | ICD-10-CM | POA: Diagnosis not present

## 2022-07-25 DIAGNOSIS — D689 Coagulation defect, unspecified: Secondary | ICD-10-CM | POA: Diagnosis not present

## 2022-07-25 DIAGNOSIS — Z79899 Other long term (current) drug therapy: Secondary | ICD-10-CM | POA: Diagnosis not present

## 2022-07-25 DIAGNOSIS — E876 Hypokalemia: Secondary | ICD-10-CM | POA: Diagnosis not present

## 2022-07-25 DIAGNOSIS — E663 Overweight: Secondary | ICD-10-CM | POA: Diagnosis not present

## 2022-07-25 DIAGNOSIS — G250 Essential tremor: Secondary | ICD-10-CM | POA: Diagnosis not present

## 2022-07-25 DIAGNOSIS — D630 Anemia in neoplastic disease: Secondary | ICD-10-CM | POA: Diagnosis not present

## 2022-07-25 DIAGNOSIS — M1990 Unspecified osteoarthritis, unspecified site: Secondary | ICD-10-CM | POA: Diagnosis not present

## 2022-07-25 DIAGNOSIS — I10 Essential (primary) hypertension: Secondary | ICD-10-CM | POA: Diagnosis not present

## 2022-07-25 DIAGNOSIS — Z9484 Stem cells transplant status: Secondary | ICD-10-CM | POA: Diagnosis not present

## 2022-07-25 DIAGNOSIS — E785 Hyperlipidemia, unspecified: Secondary | ICD-10-CM | POA: Diagnosis not present

## 2022-07-25 DIAGNOSIS — C9000 Multiple myeloma not having achieved remission: Secondary | ICD-10-CM | POA: Diagnosis not present

## 2022-07-25 DIAGNOSIS — S065X0D Traumatic subdural hemorrhage without loss of consciousness, subsequent encounter: Secondary | ICD-10-CM | POA: Diagnosis not present

## 2022-07-25 DIAGNOSIS — Z9181 History of falling: Secondary | ICD-10-CM | POA: Diagnosis not present

## 2022-07-25 DIAGNOSIS — F3342 Major depressive disorder, recurrent, in full remission: Secondary | ICD-10-CM | POA: Diagnosis not present

## 2022-07-25 DIAGNOSIS — F419 Anxiety disorder, unspecified: Secondary | ICD-10-CM | POA: Diagnosis not present

## 2022-07-25 DIAGNOSIS — Z87891 Personal history of nicotine dependence: Secondary | ICD-10-CM | POA: Diagnosis not present

## 2022-07-25 DIAGNOSIS — K219 Gastro-esophageal reflux disease without esophagitis: Secondary | ICD-10-CM | POA: Diagnosis not present

## 2022-07-25 DIAGNOSIS — Z7952 Long term (current) use of systemic steroids: Secondary | ICD-10-CM | POA: Diagnosis not present

## 2022-07-29 DIAGNOSIS — E876 Hypokalemia: Secondary | ICD-10-CM | POA: Diagnosis not present

## 2022-07-29 DIAGNOSIS — F419 Anxiety disorder, unspecified: Secondary | ICD-10-CM | POA: Diagnosis not present

## 2022-07-29 DIAGNOSIS — K219 Gastro-esophageal reflux disease without esophagitis: Secondary | ICD-10-CM | POA: Diagnosis not present

## 2022-07-29 DIAGNOSIS — D630 Anemia in neoplastic disease: Secondary | ICD-10-CM | POA: Diagnosis not present

## 2022-07-29 DIAGNOSIS — Z9181 History of falling: Secondary | ICD-10-CM | POA: Diagnosis not present

## 2022-07-29 DIAGNOSIS — E663 Overweight: Secondary | ICD-10-CM | POA: Diagnosis not present

## 2022-07-29 DIAGNOSIS — Z87891 Personal history of nicotine dependence: Secondary | ICD-10-CM | POA: Diagnosis not present

## 2022-07-29 DIAGNOSIS — S065X0D Traumatic subdural hemorrhage without loss of consciousness, subsequent encounter: Secondary | ICD-10-CM | POA: Diagnosis not present

## 2022-07-29 DIAGNOSIS — M810 Age-related osteoporosis without current pathological fracture: Secondary | ICD-10-CM | POA: Diagnosis not present

## 2022-07-29 DIAGNOSIS — D689 Coagulation defect, unspecified: Secondary | ICD-10-CM | POA: Diagnosis not present

## 2022-07-29 DIAGNOSIS — K760 Fatty (change of) liver, not elsewhere classified: Secondary | ICD-10-CM | POA: Diagnosis not present

## 2022-07-29 DIAGNOSIS — R530 Neoplastic (malignant) related fatigue: Secondary | ICD-10-CM | POA: Diagnosis not present

## 2022-07-29 DIAGNOSIS — M1990 Unspecified osteoarthritis, unspecified site: Secondary | ICD-10-CM | POA: Diagnosis not present

## 2022-07-29 DIAGNOSIS — Z7952 Long term (current) use of systemic steroids: Secondary | ICD-10-CM | POA: Diagnosis not present

## 2022-07-29 DIAGNOSIS — Z79899 Other long term (current) drug therapy: Secondary | ICD-10-CM | POA: Diagnosis not present

## 2022-07-29 DIAGNOSIS — I10 Essential (primary) hypertension: Secondary | ICD-10-CM | POA: Diagnosis not present

## 2022-07-29 DIAGNOSIS — R296 Repeated falls: Secondary | ICD-10-CM | POA: Diagnosis not present

## 2022-07-29 DIAGNOSIS — Z6829 Body mass index (BMI) 29.0-29.9, adult: Secondary | ICD-10-CM | POA: Diagnosis not present

## 2022-07-29 DIAGNOSIS — E785 Hyperlipidemia, unspecified: Secondary | ICD-10-CM | POA: Diagnosis not present

## 2022-07-29 DIAGNOSIS — Z9484 Stem cells transplant status: Secondary | ICD-10-CM | POA: Diagnosis not present

## 2022-07-29 DIAGNOSIS — K449 Diaphragmatic hernia without obstruction or gangrene: Secondary | ICD-10-CM | POA: Diagnosis not present

## 2022-07-29 DIAGNOSIS — F3342 Major depressive disorder, recurrent, in full remission: Secondary | ICD-10-CM | POA: Diagnosis not present

## 2022-07-29 DIAGNOSIS — C9000 Multiple myeloma not having achieved remission: Secondary | ICD-10-CM | POA: Diagnosis not present

## 2022-07-29 DIAGNOSIS — G250 Essential tremor: Secondary | ICD-10-CM | POA: Diagnosis not present

## 2022-07-30 DIAGNOSIS — Z9181 History of falling: Secondary | ICD-10-CM | POA: Diagnosis not present

## 2022-07-30 DIAGNOSIS — S065X0D Traumatic subdural hemorrhage without loss of consciousness, subsequent encounter: Secondary | ICD-10-CM | POA: Diagnosis not present

## 2022-07-30 DIAGNOSIS — F419 Anxiety disorder, unspecified: Secondary | ICD-10-CM | POA: Diagnosis not present

## 2022-07-30 DIAGNOSIS — E876 Hypokalemia: Secondary | ICD-10-CM | POA: Diagnosis not present

## 2022-07-30 DIAGNOSIS — Z9484 Stem cells transplant status: Secondary | ICD-10-CM | POA: Diagnosis not present

## 2022-07-30 DIAGNOSIS — C9000 Multiple myeloma not having achieved remission: Secondary | ICD-10-CM | POA: Diagnosis not present

## 2022-07-30 DIAGNOSIS — F3342 Major depressive disorder, recurrent, in full remission: Secondary | ICD-10-CM | POA: Diagnosis not present

## 2022-07-30 DIAGNOSIS — Z87891 Personal history of nicotine dependence: Secondary | ICD-10-CM | POA: Diagnosis not present

## 2022-07-30 DIAGNOSIS — Z6829 Body mass index (BMI) 29.0-29.9, adult: Secondary | ICD-10-CM | POA: Diagnosis not present

## 2022-07-30 DIAGNOSIS — K760 Fatty (change of) liver, not elsewhere classified: Secondary | ICD-10-CM | POA: Diagnosis not present

## 2022-07-30 DIAGNOSIS — R530 Neoplastic (malignant) related fatigue: Secondary | ICD-10-CM | POA: Diagnosis not present

## 2022-07-30 DIAGNOSIS — M810 Age-related osteoporosis without current pathological fracture: Secondary | ICD-10-CM | POA: Diagnosis not present

## 2022-07-30 DIAGNOSIS — D630 Anemia in neoplastic disease: Secondary | ICD-10-CM | POA: Diagnosis not present

## 2022-07-30 DIAGNOSIS — K219 Gastro-esophageal reflux disease without esophagitis: Secondary | ICD-10-CM | POA: Diagnosis not present

## 2022-07-30 DIAGNOSIS — E663 Overweight: Secondary | ICD-10-CM | POA: Diagnosis not present

## 2022-07-30 DIAGNOSIS — I10 Essential (primary) hypertension: Secondary | ICD-10-CM | POA: Diagnosis not present

## 2022-07-30 DIAGNOSIS — G250 Essential tremor: Secondary | ICD-10-CM | POA: Diagnosis not present

## 2022-07-30 DIAGNOSIS — M1990 Unspecified osteoarthritis, unspecified site: Secondary | ICD-10-CM | POA: Diagnosis not present

## 2022-07-30 DIAGNOSIS — R296 Repeated falls: Secondary | ICD-10-CM | POA: Diagnosis not present

## 2022-07-30 DIAGNOSIS — Z79899 Other long term (current) drug therapy: Secondary | ICD-10-CM | POA: Diagnosis not present

## 2022-07-30 DIAGNOSIS — Z7952 Long term (current) use of systemic steroids: Secondary | ICD-10-CM | POA: Diagnosis not present

## 2022-07-30 DIAGNOSIS — K449 Diaphragmatic hernia without obstruction or gangrene: Secondary | ICD-10-CM | POA: Diagnosis not present

## 2022-07-30 DIAGNOSIS — E785 Hyperlipidemia, unspecified: Secondary | ICD-10-CM | POA: Diagnosis not present

## 2022-07-30 DIAGNOSIS — D689 Coagulation defect, unspecified: Secondary | ICD-10-CM | POA: Diagnosis not present

## 2022-08-04 DIAGNOSIS — C9001 Multiple myeloma in remission: Secondary | ICD-10-CM | POA: Diagnosis not present

## 2022-08-04 DIAGNOSIS — I10 Essential (primary) hypertension: Secondary | ICD-10-CM | POA: Diagnosis not present

## 2022-08-04 DIAGNOSIS — Z9484 Stem cells transplant status: Secondary | ICD-10-CM | POA: Diagnosis not present

## 2022-08-04 DIAGNOSIS — R69 Illness, unspecified: Secondary | ICD-10-CM | POA: Diagnosis not present

## 2022-08-05 DIAGNOSIS — E785 Hyperlipidemia, unspecified: Secondary | ICD-10-CM | POA: Diagnosis not present

## 2022-08-05 DIAGNOSIS — Z9181 History of falling: Secondary | ICD-10-CM | POA: Diagnosis not present

## 2022-08-05 DIAGNOSIS — Z9484 Stem cells transplant status: Secondary | ICD-10-CM | POA: Diagnosis not present

## 2022-08-05 DIAGNOSIS — R296 Repeated falls: Secondary | ICD-10-CM | POA: Diagnosis not present

## 2022-08-05 DIAGNOSIS — C9000 Multiple myeloma not having achieved remission: Secondary | ICD-10-CM | POA: Diagnosis not present

## 2022-08-05 DIAGNOSIS — D630 Anemia in neoplastic disease: Secondary | ICD-10-CM | POA: Diagnosis not present

## 2022-08-05 DIAGNOSIS — K449 Diaphragmatic hernia without obstruction or gangrene: Secondary | ICD-10-CM | POA: Diagnosis not present

## 2022-08-05 DIAGNOSIS — D689 Coagulation defect, unspecified: Secondary | ICD-10-CM | POA: Diagnosis not present

## 2022-08-05 DIAGNOSIS — Z87891 Personal history of nicotine dependence: Secondary | ICD-10-CM | POA: Diagnosis not present

## 2022-08-05 DIAGNOSIS — S065X0D Traumatic subdural hemorrhage without loss of consciousness, subsequent encounter: Secondary | ICD-10-CM | POA: Diagnosis not present

## 2022-08-05 DIAGNOSIS — Z6829 Body mass index (BMI) 29.0-29.9, adult: Secondary | ICD-10-CM | POA: Diagnosis not present

## 2022-08-05 DIAGNOSIS — E663 Overweight: Secondary | ICD-10-CM | POA: Diagnosis not present

## 2022-08-05 DIAGNOSIS — M810 Age-related osteoporosis without current pathological fracture: Secondary | ICD-10-CM | POA: Diagnosis not present

## 2022-08-05 DIAGNOSIS — Z7952 Long term (current) use of systemic steroids: Secondary | ICD-10-CM | POA: Diagnosis not present

## 2022-08-05 DIAGNOSIS — M1990 Unspecified osteoarthritis, unspecified site: Secondary | ICD-10-CM | POA: Diagnosis not present

## 2022-08-05 DIAGNOSIS — K760 Fatty (change of) liver, not elsewhere classified: Secondary | ICD-10-CM | POA: Diagnosis not present

## 2022-08-05 DIAGNOSIS — K219 Gastro-esophageal reflux disease without esophagitis: Secondary | ICD-10-CM | POA: Diagnosis not present

## 2022-08-05 DIAGNOSIS — I10 Essential (primary) hypertension: Secondary | ICD-10-CM | POA: Diagnosis not present

## 2022-08-05 DIAGNOSIS — E876 Hypokalemia: Secondary | ICD-10-CM | POA: Diagnosis not present

## 2022-08-05 DIAGNOSIS — G250 Essential tremor: Secondary | ICD-10-CM | POA: Diagnosis not present

## 2022-08-05 DIAGNOSIS — Z79899 Other long term (current) drug therapy: Secondary | ICD-10-CM | POA: Diagnosis not present

## 2022-08-05 DIAGNOSIS — R530 Neoplastic (malignant) related fatigue: Secondary | ICD-10-CM | POA: Diagnosis not present

## 2022-08-05 DIAGNOSIS — F419 Anxiety disorder, unspecified: Secondary | ICD-10-CM | POA: Diagnosis not present

## 2022-08-05 DIAGNOSIS — F3342 Major depressive disorder, recurrent, in full remission: Secondary | ICD-10-CM | POA: Diagnosis not present

## 2022-08-06 DIAGNOSIS — E876 Hypokalemia: Secondary | ICD-10-CM | POA: Diagnosis not present

## 2022-08-06 DIAGNOSIS — G909 Disorder of the autonomic nervous system, unspecified: Secondary | ICD-10-CM | POA: Diagnosis not present

## 2022-08-06 DIAGNOSIS — C9001 Multiple myeloma in remission: Secondary | ICD-10-CM | POA: Diagnosis not present

## 2022-08-06 DIAGNOSIS — K3184 Gastroparesis: Secondary | ICD-10-CM | POA: Diagnosis not present

## 2022-08-06 DIAGNOSIS — Z9484 Stem cells transplant status: Secondary | ICD-10-CM | POA: Diagnosis not present

## 2022-08-06 DIAGNOSIS — R110 Nausea: Secondary | ICD-10-CM | POA: Diagnosis not present

## 2022-08-06 DIAGNOSIS — G620 Drug-induced polyneuropathy: Secondary | ICD-10-CM | POA: Diagnosis not present

## 2022-08-06 DIAGNOSIS — D638 Anemia in other chronic diseases classified elsewhere: Secondary | ICD-10-CM | POA: Diagnosis not present

## 2022-08-07 DIAGNOSIS — G250 Essential tremor: Secondary | ICD-10-CM | POA: Diagnosis not present

## 2022-08-07 DIAGNOSIS — R296 Repeated falls: Secondary | ICD-10-CM | POA: Diagnosis not present

## 2022-08-07 DIAGNOSIS — C9000 Multiple myeloma not having achieved remission: Secondary | ICD-10-CM | POA: Diagnosis not present

## 2022-08-07 DIAGNOSIS — Z6829 Body mass index (BMI) 29.0-29.9, adult: Secondary | ICD-10-CM | POA: Diagnosis not present

## 2022-08-07 DIAGNOSIS — M810 Age-related osteoporosis without current pathological fracture: Secondary | ICD-10-CM | POA: Diagnosis not present

## 2022-08-07 DIAGNOSIS — F419 Anxiety disorder, unspecified: Secondary | ICD-10-CM | POA: Diagnosis not present

## 2022-08-07 DIAGNOSIS — E785 Hyperlipidemia, unspecified: Secondary | ICD-10-CM | POA: Diagnosis not present

## 2022-08-07 DIAGNOSIS — D689 Coagulation defect, unspecified: Secondary | ICD-10-CM | POA: Diagnosis not present

## 2022-08-07 DIAGNOSIS — D630 Anemia in neoplastic disease: Secondary | ICD-10-CM | POA: Diagnosis not present

## 2022-08-07 DIAGNOSIS — E876 Hypokalemia: Secondary | ICD-10-CM | POA: Diagnosis not present

## 2022-08-07 DIAGNOSIS — R530 Neoplastic (malignant) related fatigue: Secondary | ICD-10-CM | POA: Diagnosis not present

## 2022-08-07 DIAGNOSIS — M1990 Unspecified osteoarthritis, unspecified site: Secondary | ICD-10-CM | POA: Diagnosis not present

## 2022-08-07 DIAGNOSIS — K219 Gastro-esophageal reflux disease without esophagitis: Secondary | ICD-10-CM | POA: Diagnosis not present

## 2022-08-07 DIAGNOSIS — I10 Essential (primary) hypertension: Secondary | ICD-10-CM | POA: Diagnosis not present

## 2022-08-07 DIAGNOSIS — Z7952 Long term (current) use of systemic steroids: Secondary | ICD-10-CM | POA: Diagnosis not present

## 2022-08-07 DIAGNOSIS — K760 Fatty (change of) liver, not elsewhere classified: Secondary | ICD-10-CM | POA: Diagnosis not present

## 2022-08-07 DIAGNOSIS — K449 Diaphragmatic hernia without obstruction or gangrene: Secondary | ICD-10-CM | POA: Diagnosis not present

## 2022-08-07 DIAGNOSIS — Z9181 History of falling: Secondary | ICD-10-CM | POA: Diagnosis not present

## 2022-08-07 DIAGNOSIS — E663 Overweight: Secondary | ICD-10-CM | POA: Diagnosis not present

## 2022-08-07 DIAGNOSIS — F3342 Major depressive disorder, recurrent, in full remission: Secondary | ICD-10-CM | POA: Diagnosis not present

## 2022-08-07 DIAGNOSIS — Z79899 Other long term (current) drug therapy: Secondary | ICD-10-CM | POA: Diagnosis not present

## 2022-08-07 DIAGNOSIS — Z87891 Personal history of nicotine dependence: Secondary | ICD-10-CM | POA: Diagnosis not present

## 2022-08-07 DIAGNOSIS — S065X0D Traumatic subdural hemorrhage without loss of consciousness, subsequent encounter: Secondary | ICD-10-CM | POA: Diagnosis not present

## 2022-08-07 DIAGNOSIS — Z9484 Stem cells transplant status: Secondary | ICD-10-CM | POA: Diagnosis not present

## 2022-08-08 DIAGNOSIS — Z9484 Stem cells transplant status: Secondary | ICD-10-CM | POA: Diagnosis not present

## 2022-08-08 DIAGNOSIS — C9001 Multiple myeloma in remission: Secondary | ICD-10-CM | POA: Diagnosis not present

## 2022-08-08 DIAGNOSIS — G909 Disorder of the autonomic nervous system, unspecified: Secondary | ICD-10-CM | POA: Diagnosis not present

## 2022-08-08 DIAGNOSIS — D638 Anemia in other chronic diseases classified elsewhere: Secondary | ICD-10-CM | POA: Diagnosis not present

## 2022-08-08 DIAGNOSIS — I10 Essential (primary) hypertension: Secondary | ICD-10-CM | POA: Diagnosis not present

## 2022-08-08 DIAGNOSIS — D696 Thrombocytopenia, unspecified: Secondary | ICD-10-CM | POA: Diagnosis not present

## 2022-08-08 DIAGNOSIS — Z5112 Encounter for antineoplastic immunotherapy: Secondary | ICD-10-CM | POA: Diagnosis not present

## 2022-08-08 DIAGNOSIS — D708 Other neutropenia: Secondary | ICD-10-CM | POA: Diagnosis not present

## 2022-08-19 DIAGNOSIS — H524 Presbyopia: Secondary | ICD-10-CM | POA: Diagnosis not present

## 2022-08-19 DIAGNOSIS — H2513 Age-related nuclear cataract, bilateral: Secondary | ICD-10-CM | POA: Diagnosis not present

## 2022-08-19 DIAGNOSIS — H5203 Hypermetropia, bilateral: Secondary | ICD-10-CM | POA: Diagnosis not present

## 2022-08-20 DIAGNOSIS — I10 Essential (primary) hypertension: Secondary | ICD-10-CM | POA: Diagnosis not present

## 2022-08-20 DIAGNOSIS — F419 Anxiety disorder, unspecified: Secondary | ICD-10-CM | POA: Diagnosis not present

## 2022-08-20 DIAGNOSIS — E663 Overweight: Secondary | ICD-10-CM | POA: Diagnosis not present

## 2022-08-20 DIAGNOSIS — D630 Anemia in neoplastic disease: Secondary | ICD-10-CM | POA: Diagnosis not present

## 2022-08-20 DIAGNOSIS — K760 Fatty (change of) liver, not elsewhere classified: Secondary | ICD-10-CM | POA: Diagnosis not present

## 2022-08-20 DIAGNOSIS — D689 Coagulation defect, unspecified: Secondary | ICD-10-CM | POA: Diagnosis not present

## 2022-08-20 DIAGNOSIS — E785 Hyperlipidemia, unspecified: Secondary | ICD-10-CM | POA: Diagnosis not present

## 2022-08-20 DIAGNOSIS — Z9181 History of falling: Secondary | ICD-10-CM | POA: Diagnosis not present

## 2022-08-20 DIAGNOSIS — Z87891 Personal history of nicotine dependence: Secondary | ICD-10-CM | POA: Diagnosis not present

## 2022-08-20 DIAGNOSIS — M1990 Unspecified osteoarthritis, unspecified site: Secondary | ICD-10-CM | POA: Diagnosis not present

## 2022-08-20 DIAGNOSIS — R530 Neoplastic (malignant) related fatigue: Secondary | ICD-10-CM | POA: Diagnosis not present

## 2022-08-20 DIAGNOSIS — Z6829 Body mass index (BMI) 29.0-29.9, adult: Secondary | ICD-10-CM | POA: Diagnosis not present

## 2022-08-20 DIAGNOSIS — E876 Hypokalemia: Secondary | ICD-10-CM | POA: Diagnosis not present

## 2022-08-20 DIAGNOSIS — Z7952 Long term (current) use of systemic steroids: Secondary | ICD-10-CM | POA: Diagnosis not present

## 2022-08-20 DIAGNOSIS — S065X0D Traumatic subdural hemorrhage without loss of consciousness, subsequent encounter: Secondary | ICD-10-CM | POA: Diagnosis not present

## 2022-08-20 DIAGNOSIS — F3342 Major depressive disorder, recurrent, in full remission: Secondary | ICD-10-CM | POA: Diagnosis not present

## 2022-08-20 DIAGNOSIS — K449 Diaphragmatic hernia without obstruction or gangrene: Secondary | ICD-10-CM | POA: Diagnosis not present

## 2022-08-20 DIAGNOSIS — C9000 Multiple myeloma not having achieved remission: Secondary | ICD-10-CM | POA: Diagnosis not present

## 2022-08-20 DIAGNOSIS — M810 Age-related osteoporosis without current pathological fracture: Secondary | ICD-10-CM | POA: Diagnosis not present

## 2022-08-20 DIAGNOSIS — R296 Repeated falls: Secondary | ICD-10-CM | POA: Diagnosis not present

## 2022-08-20 DIAGNOSIS — Z9484 Stem cells transplant status: Secondary | ICD-10-CM | POA: Diagnosis not present

## 2022-08-20 DIAGNOSIS — G250 Essential tremor: Secondary | ICD-10-CM | POA: Diagnosis not present

## 2022-08-20 DIAGNOSIS — K219 Gastro-esophageal reflux disease without esophagitis: Secondary | ICD-10-CM | POA: Diagnosis not present

## 2022-08-20 DIAGNOSIS — Z79899 Other long term (current) drug therapy: Secondary | ICD-10-CM | POA: Diagnosis not present

## 2022-08-21 DIAGNOSIS — Z5112 Encounter for antineoplastic immunotherapy: Secondary | ICD-10-CM | POA: Diagnosis not present

## 2022-08-21 DIAGNOSIS — C9001 Multiple myeloma in remission: Secondary | ICD-10-CM | POA: Diagnosis not present

## 2022-08-25 DIAGNOSIS — C9001 Multiple myeloma in remission: Secondary | ICD-10-CM | POA: Diagnosis not present

## 2022-08-26 DIAGNOSIS — R296 Repeated falls: Secondary | ICD-10-CM | POA: Diagnosis not present

## 2022-08-26 DIAGNOSIS — F3342 Major depressive disorder, recurrent, in full remission: Secondary | ICD-10-CM | POA: Diagnosis not present

## 2022-08-26 DIAGNOSIS — E785 Hyperlipidemia, unspecified: Secondary | ICD-10-CM | POA: Diagnosis not present

## 2022-08-26 DIAGNOSIS — K449 Diaphragmatic hernia without obstruction or gangrene: Secondary | ICD-10-CM | POA: Diagnosis not present

## 2022-08-26 DIAGNOSIS — Z87891 Personal history of nicotine dependence: Secondary | ICD-10-CM | POA: Diagnosis not present

## 2022-08-26 DIAGNOSIS — M810 Age-related osteoporosis without current pathological fracture: Secondary | ICD-10-CM | POA: Diagnosis not present

## 2022-08-26 DIAGNOSIS — K219 Gastro-esophageal reflux disease without esophagitis: Secondary | ICD-10-CM | POA: Diagnosis not present

## 2022-08-26 DIAGNOSIS — Z9484 Stem cells transplant status: Secondary | ICD-10-CM | POA: Diagnosis not present

## 2022-08-26 DIAGNOSIS — K760 Fatty (change of) liver, not elsewhere classified: Secondary | ICD-10-CM | POA: Diagnosis not present

## 2022-08-26 DIAGNOSIS — D630 Anemia in neoplastic disease: Secondary | ICD-10-CM | POA: Diagnosis not present

## 2022-08-26 DIAGNOSIS — M1990 Unspecified osteoarthritis, unspecified site: Secondary | ICD-10-CM | POA: Diagnosis not present

## 2022-08-26 DIAGNOSIS — S065X0D Traumatic subdural hemorrhage without loss of consciousness, subsequent encounter: Secondary | ICD-10-CM | POA: Diagnosis not present

## 2022-08-26 DIAGNOSIS — I10 Essential (primary) hypertension: Secondary | ICD-10-CM | POA: Diagnosis not present

## 2022-08-26 DIAGNOSIS — G250 Essential tremor: Secondary | ICD-10-CM | POA: Diagnosis not present

## 2022-08-26 DIAGNOSIS — Z6829 Body mass index (BMI) 29.0-29.9, adult: Secondary | ICD-10-CM | POA: Diagnosis not present

## 2022-08-26 DIAGNOSIS — E663 Overweight: Secondary | ICD-10-CM | POA: Diagnosis not present

## 2022-08-26 DIAGNOSIS — Z9181 History of falling: Secondary | ICD-10-CM | POA: Diagnosis not present

## 2022-08-26 DIAGNOSIS — F419 Anxiety disorder, unspecified: Secondary | ICD-10-CM | POA: Diagnosis not present

## 2022-08-26 DIAGNOSIS — E876 Hypokalemia: Secondary | ICD-10-CM | POA: Diagnosis not present

## 2022-08-26 DIAGNOSIS — Z79899 Other long term (current) drug therapy: Secondary | ICD-10-CM | POA: Diagnosis not present

## 2022-08-26 DIAGNOSIS — C9000 Multiple myeloma not having achieved remission: Secondary | ICD-10-CM | POA: Diagnosis not present

## 2022-08-26 DIAGNOSIS — R530 Neoplastic (malignant) related fatigue: Secondary | ICD-10-CM | POA: Diagnosis not present

## 2022-08-26 DIAGNOSIS — Z7952 Long term (current) use of systemic steroids: Secondary | ICD-10-CM | POA: Diagnosis not present

## 2022-08-26 DIAGNOSIS — D689 Coagulation defect, unspecified: Secondary | ICD-10-CM | POA: Diagnosis not present

## 2022-09-01 DIAGNOSIS — B3781 Candidal esophagitis: Secondary | ICD-10-CM | POA: Diagnosis not present

## 2022-09-01 DIAGNOSIS — Z9484 Stem cells transplant status: Secondary | ICD-10-CM | POA: Diagnosis not present

## 2022-09-01 DIAGNOSIS — C9000 Multiple myeloma not having achieved remission: Secondary | ICD-10-CM | POA: Diagnosis not present

## 2022-09-01 DIAGNOSIS — K2930 Chronic superficial gastritis without bleeding: Secondary | ICD-10-CM | POA: Diagnosis not present

## 2022-09-02 DIAGNOSIS — R530 Neoplastic (malignant) related fatigue: Secondary | ICD-10-CM | POA: Diagnosis not present

## 2022-09-02 DIAGNOSIS — Z79899 Other long term (current) drug therapy: Secondary | ICD-10-CM | POA: Diagnosis not present

## 2022-09-02 DIAGNOSIS — E663 Overweight: Secondary | ICD-10-CM | POA: Diagnosis not present

## 2022-09-02 DIAGNOSIS — K760 Fatty (change of) liver, not elsewhere classified: Secondary | ICD-10-CM | POA: Diagnosis not present

## 2022-09-02 DIAGNOSIS — I10 Essential (primary) hypertension: Secondary | ICD-10-CM | POA: Diagnosis not present

## 2022-09-02 DIAGNOSIS — C9000 Multiple myeloma not having achieved remission: Secondary | ICD-10-CM | POA: Diagnosis not present

## 2022-09-02 DIAGNOSIS — R296 Repeated falls: Secondary | ICD-10-CM | POA: Diagnosis not present

## 2022-09-02 DIAGNOSIS — Z6829 Body mass index (BMI) 29.0-29.9, adult: Secondary | ICD-10-CM | POA: Diagnosis not present

## 2022-09-02 DIAGNOSIS — D689 Coagulation defect, unspecified: Secondary | ICD-10-CM | POA: Diagnosis not present

## 2022-09-02 DIAGNOSIS — K449 Diaphragmatic hernia without obstruction or gangrene: Secondary | ICD-10-CM | POA: Diagnosis not present

## 2022-09-02 DIAGNOSIS — G250 Essential tremor: Secondary | ICD-10-CM | POA: Diagnosis not present

## 2022-09-02 DIAGNOSIS — F3342 Major depressive disorder, recurrent, in full remission: Secondary | ICD-10-CM | POA: Diagnosis not present

## 2022-09-02 DIAGNOSIS — Z7952 Long term (current) use of systemic steroids: Secondary | ICD-10-CM | POA: Diagnosis not present

## 2022-09-02 DIAGNOSIS — S065X0D Traumatic subdural hemorrhage without loss of consciousness, subsequent encounter: Secondary | ICD-10-CM | POA: Diagnosis not present

## 2022-09-02 DIAGNOSIS — E785 Hyperlipidemia, unspecified: Secondary | ICD-10-CM | POA: Diagnosis not present

## 2022-09-02 DIAGNOSIS — K219 Gastro-esophageal reflux disease without esophagitis: Secondary | ICD-10-CM | POA: Diagnosis not present

## 2022-09-02 DIAGNOSIS — M810 Age-related osteoporosis without current pathological fracture: Secondary | ICD-10-CM | POA: Diagnosis not present

## 2022-09-02 DIAGNOSIS — F419 Anxiety disorder, unspecified: Secondary | ICD-10-CM | POA: Diagnosis not present

## 2022-09-02 DIAGNOSIS — Z9484 Stem cells transplant status: Secondary | ICD-10-CM | POA: Diagnosis not present

## 2022-09-02 DIAGNOSIS — Z9181 History of falling: Secondary | ICD-10-CM | POA: Diagnosis not present

## 2022-09-02 DIAGNOSIS — D630 Anemia in neoplastic disease: Secondary | ICD-10-CM | POA: Diagnosis not present

## 2022-09-02 DIAGNOSIS — E876 Hypokalemia: Secondary | ICD-10-CM | POA: Diagnosis not present

## 2022-09-02 DIAGNOSIS — M1990 Unspecified osteoarthritis, unspecified site: Secondary | ICD-10-CM | POA: Diagnosis not present

## 2022-09-02 DIAGNOSIS — Z87891 Personal history of nicotine dependence: Secondary | ICD-10-CM | POA: Diagnosis not present

## 2022-09-08 DIAGNOSIS — Z7952 Long term (current) use of systemic steroids: Secondary | ICD-10-CM | POA: Diagnosis not present

## 2022-09-08 DIAGNOSIS — D689 Coagulation defect, unspecified: Secondary | ICD-10-CM | POA: Diagnosis not present

## 2022-09-08 DIAGNOSIS — Z87891 Personal history of nicotine dependence: Secondary | ICD-10-CM | POA: Diagnosis not present

## 2022-09-08 DIAGNOSIS — E876 Hypokalemia: Secondary | ICD-10-CM | POA: Diagnosis not present

## 2022-09-08 DIAGNOSIS — M1990 Unspecified osteoarthritis, unspecified site: Secondary | ICD-10-CM | POA: Diagnosis not present

## 2022-09-08 DIAGNOSIS — I10 Essential (primary) hypertension: Secondary | ICD-10-CM | POA: Diagnosis not present

## 2022-09-08 DIAGNOSIS — M810 Age-related osteoporosis without current pathological fracture: Secondary | ICD-10-CM | POA: Diagnosis not present

## 2022-09-08 DIAGNOSIS — S065X0D Traumatic subdural hemorrhage without loss of consciousness, subsequent encounter: Secondary | ICD-10-CM | POA: Diagnosis not present

## 2022-09-08 DIAGNOSIS — F419 Anxiety disorder, unspecified: Secondary | ICD-10-CM | POA: Diagnosis not present

## 2022-09-08 DIAGNOSIS — G250 Essential tremor: Secondary | ICD-10-CM | POA: Diagnosis not present

## 2022-09-08 DIAGNOSIS — K449 Diaphragmatic hernia without obstruction or gangrene: Secondary | ICD-10-CM | POA: Diagnosis not present

## 2022-09-08 DIAGNOSIS — Z6829 Body mass index (BMI) 29.0-29.9, adult: Secondary | ICD-10-CM | POA: Diagnosis not present

## 2022-09-08 DIAGNOSIS — K219 Gastro-esophageal reflux disease without esophagitis: Secondary | ICD-10-CM | POA: Diagnosis not present

## 2022-09-08 DIAGNOSIS — R530 Neoplastic (malignant) related fatigue: Secondary | ICD-10-CM | POA: Diagnosis not present

## 2022-09-08 DIAGNOSIS — R296 Repeated falls: Secondary | ICD-10-CM | POA: Diagnosis not present

## 2022-09-08 DIAGNOSIS — C9000 Multiple myeloma not having achieved remission: Secondary | ICD-10-CM | POA: Diagnosis not present

## 2022-09-08 DIAGNOSIS — Z9181 History of falling: Secondary | ICD-10-CM | POA: Diagnosis not present

## 2022-09-08 DIAGNOSIS — D630 Anemia in neoplastic disease: Secondary | ICD-10-CM | POA: Diagnosis not present

## 2022-09-08 DIAGNOSIS — Z79899 Other long term (current) drug therapy: Secondary | ICD-10-CM | POA: Diagnosis not present

## 2022-09-08 DIAGNOSIS — Z9484 Stem cells transplant status: Secondary | ICD-10-CM | POA: Diagnosis not present

## 2022-09-08 DIAGNOSIS — K760 Fatty (change of) liver, not elsewhere classified: Secondary | ICD-10-CM | POA: Diagnosis not present

## 2022-09-08 DIAGNOSIS — E663 Overweight: Secondary | ICD-10-CM | POA: Diagnosis not present

## 2022-09-08 DIAGNOSIS — E785 Hyperlipidemia, unspecified: Secondary | ICD-10-CM | POA: Diagnosis not present

## 2022-09-08 DIAGNOSIS — F3342 Major depressive disorder, recurrent, in full remission: Secondary | ICD-10-CM | POA: Diagnosis not present

## 2022-09-26 DIAGNOSIS — J069 Acute upper respiratory infection, unspecified: Secondary | ICD-10-CM | POA: Diagnosis not present

## 2022-09-26 DIAGNOSIS — Z20822 Contact with and (suspected) exposure to covid-19: Secondary | ICD-10-CM | POA: Diagnosis not present

## 2022-09-26 DIAGNOSIS — R52 Pain, unspecified: Secondary | ICD-10-CM | POA: Diagnosis not present

## 2022-09-26 DIAGNOSIS — J029 Acute pharyngitis, unspecified: Secondary | ICD-10-CM | POA: Diagnosis not present

## 2022-09-29 DIAGNOSIS — G620 Drug-induced polyneuropathy: Secondary | ICD-10-CM | POA: Diagnosis not present

## 2022-09-29 DIAGNOSIS — C9001 Multiple myeloma in remission: Secondary | ICD-10-CM | POA: Diagnosis not present

## 2022-09-29 DIAGNOSIS — Z9484 Stem cells transplant status: Secondary | ICD-10-CM | POA: Diagnosis not present

## 2022-09-29 DIAGNOSIS — R509 Fever, unspecified: Secondary | ICD-10-CM | POA: Diagnosis not present

## 2022-09-29 DIAGNOSIS — R300 Dysuria: Secondary | ICD-10-CM | POA: Diagnosis not present

## 2022-09-29 DIAGNOSIS — R051 Acute cough: Secondary | ICD-10-CM | POA: Diagnosis not present

## 2022-09-29 DIAGNOSIS — D696 Thrombocytopenia, unspecified: Secondary | ICD-10-CM | POA: Diagnosis not present

## 2022-09-29 DIAGNOSIS — T451X5A Adverse effect of antineoplastic and immunosuppressive drugs, initial encounter: Secondary | ICD-10-CM | POA: Diagnosis not present

## 2022-09-29 DIAGNOSIS — Z5112 Encounter for antineoplastic immunotherapy: Secondary | ICD-10-CM | POA: Diagnosis not present

## 2022-09-30 ENCOUNTER — Other Ambulatory Visit: Payer: Self-pay | Admitting: Internal Medicine

## 2022-09-30 DIAGNOSIS — Z1231 Encounter for screening mammogram for malignant neoplasm of breast: Secondary | ICD-10-CM

## 2022-10-02 DIAGNOSIS — L821 Other seborrheic keratosis: Secondary | ICD-10-CM | POA: Diagnosis not present

## 2022-10-02 DIAGNOSIS — L578 Other skin changes due to chronic exposure to nonionizing radiation: Secondary | ICD-10-CM | POA: Diagnosis not present

## 2022-10-02 DIAGNOSIS — D045 Carcinoma in situ of skin of trunk: Secondary | ICD-10-CM | POA: Diagnosis not present

## 2022-10-02 DIAGNOSIS — L814 Other melanin hyperpigmentation: Secondary | ICD-10-CM | POA: Diagnosis not present

## 2022-10-02 DIAGNOSIS — D1801 Hemangioma of skin and subcutaneous tissue: Secondary | ICD-10-CM | POA: Diagnosis not present

## 2022-10-02 DIAGNOSIS — L570 Actinic keratosis: Secondary | ICD-10-CM | POA: Diagnosis not present

## 2022-10-02 DIAGNOSIS — D225 Melanocytic nevi of trunk: Secondary | ICD-10-CM | POA: Diagnosis not present

## 2022-10-02 DIAGNOSIS — D485 Neoplasm of uncertain behavior of skin: Secondary | ICD-10-CM | POA: Diagnosis not present

## 2022-10-02 DIAGNOSIS — Z85828 Personal history of other malignant neoplasm of skin: Secondary | ICD-10-CM | POA: Diagnosis not present

## 2022-10-08 DIAGNOSIS — C9001 Multiple myeloma in remission: Secondary | ICD-10-CM | POA: Diagnosis not present

## 2022-10-08 DIAGNOSIS — K3184 Gastroparesis: Secondary | ICD-10-CM | POA: Diagnosis not present

## 2022-10-08 DIAGNOSIS — G909 Disorder of the autonomic nervous system, unspecified: Secondary | ICD-10-CM | POA: Diagnosis not present

## 2022-10-08 DIAGNOSIS — Z9484 Stem cells transplant status: Secondary | ICD-10-CM | POA: Diagnosis not present

## 2022-10-08 DIAGNOSIS — Z79899 Other long term (current) drug therapy: Secondary | ICD-10-CM | POA: Diagnosis not present

## 2022-10-08 DIAGNOSIS — G620 Drug-induced polyneuropathy: Secondary | ICD-10-CM | POA: Diagnosis not present

## 2022-10-08 DIAGNOSIS — J40 Bronchitis, not specified as acute or chronic: Secondary | ICD-10-CM | POA: Diagnosis not present

## 2022-10-08 DIAGNOSIS — R110 Nausea: Secondary | ICD-10-CM | POA: Diagnosis not present

## 2022-10-08 DIAGNOSIS — I10 Essential (primary) hypertension: Secondary | ICD-10-CM | POA: Diagnosis not present

## 2022-10-08 DIAGNOSIS — Z23 Encounter for immunization: Secondary | ICD-10-CM | POA: Diagnosis not present

## 2022-10-20 ENCOUNTER — Ambulatory Visit (INDEPENDENT_AMBULATORY_CARE_PROVIDER_SITE_OTHER): Payer: Medicare HMO | Admitting: Family Medicine

## 2022-10-20 ENCOUNTER — Encounter: Payer: Self-pay | Admitting: Family Medicine

## 2022-10-20 VITALS — BP 118/72 | HR 89 | Temp 97.8°F | Ht 64.0 in | Wt 140.6 lb

## 2022-10-20 DIAGNOSIS — G8929 Other chronic pain: Secondary | ICD-10-CM | POA: Diagnosis not present

## 2022-10-20 DIAGNOSIS — M545 Low back pain, unspecified: Secondary | ICD-10-CM

## 2022-10-20 DIAGNOSIS — K581 Irritable bowel syndrome with constipation: Secondary | ICD-10-CM | POA: Diagnosis not present

## 2022-10-20 DIAGNOSIS — K219 Gastro-esophageal reflux disease without esophagitis: Secondary | ICD-10-CM | POA: Diagnosis not present

## 2022-10-20 DIAGNOSIS — T451X5A Adverse effect of antineoplastic and immunosuppressive drugs, initial encounter: Secondary | ICD-10-CM

## 2022-10-20 DIAGNOSIS — D849 Immunodeficiency, unspecified: Secondary | ICD-10-CM

## 2022-10-20 DIAGNOSIS — C9000 Multiple myeloma not having achieved remission: Secondary | ICD-10-CM | POA: Diagnosis not present

## 2022-10-20 DIAGNOSIS — G63 Polyneuropathy in diseases classified elsewhere: Secondary | ICD-10-CM

## 2022-10-20 DIAGNOSIS — F419 Anxiety disorder, unspecified: Secondary | ICD-10-CM

## 2022-10-20 MED ORDER — SERTRALINE HCL 50 MG PO TABS
75.0000 mg | ORAL_TABLET | Freq: Every day | ORAL | 2 refills | Status: AC
Start: 2022-10-20 — End: 2023-01-18

## 2022-10-20 NOTE — Assessment & Plan Note (Signed)
History of three back surgeries. Reports back spasms and is starting physical therapy. Recently prescribed Pregabalin for neuropathy which may also help with back spasms. -Start Pregabalin 75mg  twice daily. -Start physical therapy.

## 2022-10-20 NOTE — Patient Instructions (Signed)
VISIT SUMMARY:  During your visit, we discussed your history of multiple myeloma, anxiety, back pain, and high blood pressure. We also talked about your medications and made some changes to better manage your symptoms. You mentioned that you have been experiencing emotional blunting with your current anxiety medication, Sertraline, and we decided to reduce the dosage. We also discussed starting you on Pregabalin for your back spasms and neuropathy in your right hand and feet. We will continue with your current treatment plan for your multiple myeloma and gastroesophageal reflux disease.  YOUR PLAN:  -ANXIETY: Anxiety is a feeling of unease, such as worry or fear, that can be mild or severe. We will reduce your Sertraline dosage to 75mg  daily and monitor for any withdrawal symptoms.  -BACK PAIN: Back pain can have causes that aren't due to underlying disease.Start you on Pregabalin for your back spasms and consider Flexeril after evaluating your response to Pregabalin and physical therapy.  -PERIPHERAL NEUROPATHY: Peripheral neuropathy, a result of damage to the nerves outside of the brain and spinal cord, often causes weakness, numbness and pain, usually in your hands and feet. We will start you on Pregabalin for this condition.  -MULTIPLE MYELOMA: Multiple myeloma is a cancer that forms in a type of white blood cell called a plasma cell. We will continue with your current oncology treatment plan.  -GASTROESOPHAGEAL REFLUX DISEASE: Gastroesophageal reflux disease (GERD) is a chronic condition where stomach acid frequently flows back into the tube connecting your mouth and stomach. We will continue with your current treatment of Omeprazole 20mg  daily.  -GENERAL HEALTH MAINTENANCE: We will continue with your current general health maintenance plan, which includes taking Valacyclovir due to negated vaccines from chemotherapy and Vitamin E supplementation for previous  deficiency.  INSTRUCTIONS:  Please follow the new medication plan and continue with your current treatments. We will follow up in 1 month to evaluate your response to the medication changes and physical therapy.

## 2022-10-20 NOTE — Assessment & Plan Note (Signed)
Managed with Omeprazole 20mg  daily. -Continue Omeprazole 20mg  daily.

## 2022-10-20 NOTE — Assessment & Plan Note (Addendum)
Currently managed with Sertraline 100mg  daily and Lorazepam 0.5mg  at night as needed. Patient reports feeling emotionally blunted on current dose of Sertraline. -Reduce Sertraline to 75mg  daily and monitor for withdrawal symptoms.

## 2022-10-20 NOTE — Assessment & Plan Note (Addendum)
Under the care of an oncologist. Recently completed chemotherapy. -Continue current oncology follow-up.

## 2022-10-20 NOTE — Progress Notes (Signed)
Assessment/Plan:   Problem List Items Addressed This Visit       Digestive   Irritable bowel syndrome with constipation   Gastroesophageal reflux disease    Managed with Omeprazole 20mg  daily. -Continue Omeprazole 20mg  daily.       CINV (chemotherapy-induced nausea and vomiting)     Nervous and Auditory   Polyneuropathy associated with underlying disease (HCC)    Neuropathy in right hand and feet likely secondary to chemotherapy and  multiple myeloma. Recently switched from Gabapentin to Pregabalin. -Start Pregabalin 75mg  twice daily.      Relevant Medications   pregabalin (LYRICA) 75 MG capsule   LORazepam (ATIVAN) 0.5 MG tablet   sertraline (ZOLOFT) 50 MG tablet     Other   Multiple myeloma (HCC) - Primary    Under the care of an oncologist. Recently completed chemotherapy. -Continue current oncology follow-up.      Relevant Medications   pregabalin (LYRICA) 75 MG capsule   LORazepam (ATIVAN) 0.5 MG tablet   valACYclovir (VALTREX) 500 MG tablet   Anxiety    Currently managed with Sertraline 100mg  daily and Lorazepam 0.5mg  at night as needed. Patient reports feeling emotionally blunted on current dose of Sertraline. -Reduce Sertraline to 75mg  daily and monitor for withdrawal symptoms.      Relevant Medications   LORazepam (ATIVAN) 0.5 MG tablet   sertraline (ZOLOFT) 50 MG tablet   Immunocompromised (HCC)   Chronic low back pain     History of three back surgeries. Reports back spasms and is starting physical therapy. Recently prescribed Pregabalin for neuropathy which may also help with back spasms. -Start Pregabalin 75mg  twice daily. -Start physical therapy.       Relevant Medications   pregabalin (LYRICA) 75 MG capsule   sertraline (ZOLOFT) 50 MG tablet    Medications Discontinued During This Encounter  Medication Reason   atorvastatin (LIPITOR) 10 MG tablet    buPROPion (WELLBUTRIN SR) 150 MG 12 hr tablet    estradiol (ESTRACE) 0.5 MG tablet     LINZESS 145 MCG CAPS capsule    meclizine (ANTIVERT) 25 MG tablet    phentermine (ADIPEX-P) 37.5 MG tablet    sertraline (ZOLOFT) 50 MG tablet Reorder    Return in about 4 weeks (around 11/17/2022) for anxiety.    Subjective:   Encounter date: 10/20/2022  Anna Castillo is a 67 y.o. female who has Multiple myeloma (HCC); Anxiety; Irritable bowel syndrome with constipation; Gastroesophageal reflux disease; CINV (chemotherapy-induced nausea and vomiting); Immunocompromised (HCC); Chronic low back pain; and Polyneuropathy associated with underlying disease (HCC) on their problem list..   She  has a past medical history of Anemia (May 2023), Anxiety, Arthritis, Blood transfusion without reported diagnosis, Cancer (HCC), Chronic back pain, Dizziness, GERD (gastroesophageal reflux disease), Heart palpitations, Hypercholesterolemia, Hypertension, Lightheadedness, Multiple myeloma (HCC), Neuromuscular disorder (HCC) (Jan 2024), Obesity, PONV (postoperative nausea and vomiting), Post-menopausal, SOB (shortness of breath), and Tachycardia..   She presents with chief complaint of Establish Care (Reduce zoloft 100 mg. Refill on flexeril )  Discussed the use of AI scribe software for clinical note transcription with the patient, who gave verbal consent to proceed.  History of Present Illness   The patient, with a history of multiple myeloma and anxiety, presents for establishing care and medication refills. She reports a history of three back surgeries and experience back spasms, for which she has previously used Flexeril. She is scheduled to start physical therapy for this issue. She also reports neuropathy in the right  hand and feet, which she hopes will improve with the initiation of Pregabalin (Lyrica) today.  The patient has been on Sertraline (Zoloft) for anxiety for over five years, but recently increased the dose from 50mg  to 100mg . She reports feeling emotionally blunted on the higher dose and  expresses a desire to reduce it. She also takes Lorazepam (0.5mg ) at night as needed, primarily for sleep issues.  In addition to the above, the patient is on Propranolol, which she believes is for her heart, but also helps with anxiety. She also takes Omeprazole daily for heartburn and has Zofran on hand for nausea, although she hasn't needed it for about a month since treating a fungal infection in her esophagus. She also takes Valacyclovir for cold sore suppression due to her vaccines being negated by chemotherapy. She supplements with Vitamin E due to a past deficiency.  The patient has a history of high blood pressure and orthostatic hypotension, which has led to falls in the past. She uses a walker for mobility in public places, but not at home. She reports no chest pain, shortness of breath, or other symptoms at this time.       Review of Systems  Respiratory:  Negative for shortness of breath.   Cardiovascular:  Negative for chest pain.  Gastrointestinal:  Positive for heartburn and nausea.  Musculoskeletal:  Positive for back pain and falls.  Neurological:  Positive for sensory change.  Psychiatric/Behavioral:  The patient is nervous/anxious and has insomnia.   All other systems reviewed and are negative.     10/20/2022    1:48 PM  Depression screen PHQ 2/9  Decreased Interest 0  Down, Depressed, Hopeless 0  PHQ - 2 Score 0  Altered sleeping 0  Tired, decreased energy 0  Change in appetite 0  Feeling bad or failure about yourself  0  Trouble concentrating 0  Moving slowly or fidgety/restless 0  Suicidal thoughts 0  PHQ-9 Score 0  Difficult doing work/chores Not difficult at all      10/20/2022    1:48 PM  GAD 7 : Generalized Anxiety Score  Nervous, Anxious, on Edge 0  Control/stop worrying 0  Worry too much - different things 0  Trouble relaxing 0  Restless 0  Easily annoyed or irritable 0  Afraid - awful might happen 0  Total GAD 7 Score 0  Anxiety Difficulty Not  difficult at all      Past Surgical History:  Procedure Laterality Date   ABDOMINAL HYSTERECTOMY     BACK SURGERY     BREATH TEK H PYLORI N/A 05/02/2014   Procedure: BREATH TEK H PYLORI;  Surgeon: Ovidio Kin, MD;  Location: [facility];  Service: General;  Laterality: N/A;   CESAREAN SECTION     CESAREAN SECTION     CHOLECYSTECTOMY     colonscopy      LAPAROSCOPIC GASTRIC SLEEVE RESECTION N/A 06/27/2014   Procedure: LAPAROSCOPIC SLEEVE GASTRETOMY WITH UPPER ENDOSCOPY;  Surgeon: Ovidio Kin, MD;  Location: [facility];  Service: General;  Laterality: N/A;   PARTIAL HYSTERECTOMY     SPINE SURGERY     TONSILLECTOMY     TONSILLECTOMY      Outpatient Medications Prior to Visit  Medication Sig Dispense Refill   ALPRAZolam (XANAX) 0.25 MG tablet Take 0.25 mg by mouth at bedtime as needed for sleep.     LORazepam (ATIVAN) 0.5 MG tablet TAKE 1 TABLET BY MOUTH 3 TIMES A DAY AS NEEDED FOR ANXIETY OR NAUSEA FOR  UP TO 10 DAYS, MAX DAILY AMOUNT 1.5MG      omeprazole (PRILOSEC OTC) 20 MG tablet Take 20 mg by mouth daily.     ondansetron (ZOFRAN) 4 MG tablet Take 1 tablet (4 mg total) by mouth every 6 (six) hours. 12 tablet 0   pregabalin (LYRICA) 75 MG capsule Take by mouth.     propranolol (INDERAL) 10 MG tablet Take 1 tablet by mouth 3 (three) times daily.     valACYclovir (VALTREX) 500 MG tablet Take by mouth.     vitamin E (VITAMIN E) 400 UNIT capsule Take 400 Units by mouth daily.     estradiol (ESTRACE) 0.5 MG tablet Take 0.5 mg by mouth daily.     LINZESS 145 MCG CAPS capsule Take 145 mcg by mouth every evening.     phentermine (ADIPEX-P) 37.5 MG tablet Take 37.5 mg by mouth daily before breakfast.     sertraline (ZOLOFT) 50 MG tablet Take 75 mg by mouth daily.     atorvastatin (LIPITOR) 10 MG tablet Take 10 mg by mouth daily.     buPROPion (WELLBUTRIN SR) 150 MG 12 hr tablet Take 150 mg by mouth daily. (Patient not taking: Reported on 10/20/2022)     meclizine (ANTIVERT) 25 MG tablet Take  1 tablet (25 mg total) by mouth 3 (three) times daily as needed for dizziness. (Patient not taking: Reported on 10/20/2022) 15 tablet 1   No facility-administered medications prior to visit.    Family History  Problem Relation Age of Onset   Hypertension Father    Diabetes Father    Breast cancer Mother    Ovarian cancer Sister    Breast cancer Sister     Social History   Socioeconomic History   Marital status: Divorced    Spouse name: Not on file   Number of children: Not on file   Years of education: Not on file   Highest education level: Not on file  Occupational History   Not on file  Tobacco Use   Smoking status: Former    Packs/day: 0.50    Years: 30.00    Additional pack years: 0.00    Total pack years: 15.00    Types: Cigarettes    Quit date: 03/14/2014    Years since quitting: 8.6    Passive exposure: Never   Smokeless tobacco: Never  Vaping Use   Vaping Use: Never used  Substance and Sexual Activity   Alcohol use: Yes    Comment: weekends drinks 3 glasses of wine each time    Drug use: No   Sexual activity: Not Currently  Other Topics Concern   Not on file  Social History Narrative   Not on file   Social Determinants of Health   Financial Resource Strain: Not on file  Food Insecurity: Not on file  Transportation Needs: Not on file  Physical Activity: Not on file  Stress: Not on file  Social Connections: Not on file  Intimate Partner Violence: Not on file  Objective:  Physical Exam: BP 118/72 (BP Location: Left Arm, Patient Position: Sitting, Cuff Size: Large)   Pulse 89   Temp 97.8 F (36.6 C) (Temporal)   Ht 5\' 4"  (1.626 m)   Wt 140 lb 9.6 oz (63.8 kg)   SpO2 100%   BMI 24.13 kg/m     Physical Exam Constitutional:      General: She is not in acute distress.    Appearance: Normal appearance. She is not ill-appearing or toxic-appearing.   HENT:     Head: Normocephalic and atraumatic.     Nose: Nose normal. No congestion.  Eyes:     General: No scleral icterus.    Extraocular Movements: Extraocular movements intact.  Cardiovascular:     Rate and Rhythm: Normal rate and regular rhythm.     Pulses: Normal pulses.     Heart sounds: Normal heart sounds.  Pulmonary:     Effort: Pulmonary effort is normal. No respiratory distress.     Breath sounds: Normal breath sounds.  Abdominal:     General: Abdomen is flat. Bowel sounds are normal.     Palpations: Abdomen is soft.  Musculoskeletal:        General: Normal range of motion.  Lymphadenopathy:     Cervical: No cervical adenopathy.  Skin:    General: Skin is warm and dry.     Findings: No rash.  Neurological:     General: No focal deficit present.     Mental Status: She is alert and oriented to person, place, and time. Mental status is at baseline.  Psychiatric:        Mood and Affect: Affect is not inappropriate.        Behavior: Behavior is cooperative.        Judgment: Judgment is not impulsive.     Results   DIAGNOSTIC Endoscopy: Esophageal candidiasis       No results found.  No results found for this or any previous visit (from the past 2160 hour(s)).      Garner Nash, MD, MS

## 2022-10-20 NOTE — Assessment & Plan Note (Signed)
Neuropathy in right hand and feet likely secondary to chemotherapy and  multiple myeloma. Recently switched from Gabapentin to Pregabalin. -Start Pregabalin 75mg  twice daily.

## 2022-10-22 ENCOUNTER — Encounter: Payer: Self-pay | Admitting: Physical Therapy

## 2022-10-22 ENCOUNTER — Ambulatory Visit: Payer: Medicare HMO | Attending: Hematology and Oncology | Admitting: Physical Therapy

## 2022-10-22 DIAGNOSIS — R2681 Unsteadiness on feet: Secondary | ICD-10-CM | POA: Diagnosis not present

## 2022-10-22 DIAGNOSIS — R262 Difficulty in walking, not elsewhere classified: Secondary | ICD-10-CM | POA: Insufficient documentation

## 2022-10-22 DIAGNOSIS — M6281 Muscle weakness (generalized): Secondary | ICD-10-CM | POA: Insufficient documentation

## 2022-10-22 DIAGNOSIS — R296 Repeated falls: Secondary | ICD-10-CM | POA: Insufficient documentation

## 2022-10-22 NOTE — Therapy (Signed)
OUTPATIENT PHYSICAL THERAPY LOWER EXTREMITY EVALUATION   Patient Name: Anna Castillo MRN: [id number] DOB:02/26/1956, 67 y.o., female Today's Date: 10/22/2022  END OF SESSION:  PT End of Session - 10/22/22 1806     Visit Number 1    Date for PT Re-Evaluation 12/31/22    Authorization Type [employer] MCR    Progress Note Due on Visit 10    PT Start Time 1535    PT Stop Time 1615    PT Time Calculation (min) 40 min    Activity Tolerance Patient tolerated treatment well    Behavior During Therapy [facility] for tasks assessed/performed             Past Medical History:  Diagnosis Date   Anemia May 2023   Anxiety    Arthritis    Blood transfusion without reported diagnosis    Cancer (HCC)    Chronic back pain    Dizziness    GERD (gastroesophageal reflux disease)    Heart palpitations    Hypercholesterolemia    Hypertension    Lightheadedness    Multiple myeloma (HCC)    Neuromuscular disorder (HCC) Jan 2024   Peripheral Neuropathy   Obesity    PONV (postoperative nausea and vomiting)    Post-menopausal    at age 34   SOB (shortness of breath)    climbing stairs   Tachycardia    Past Surgical History:  Procedure Laterality Date   ABDOMINAL HYSTERECTOMY     BACK SURGERY     BREATH TEK H PYLORI N/A 05/02/2014   Procedure: BREATH TEK H PYLORI;  Surgeon: Ovidio Kin, MD;  Location: [facility];  Service: General;  Laterality: N/A;   CESAREAN SECTION     CESAREAN SECTION     CHOLECYSTECTOMY     colonscopy      LAPAROSCOPIC GASTRIC SLEEVE RESECTION N/A 06/27/2014   Procedure: LAPAROSCOPIC SLEEVE GASTRETOMY WITH UPPER ENDOSCOPY;  Surgeon: Ovidio Kin, MD;  Location: [facility];  Service: General;  Laterality: N/A;   PARTIAL HYSTERECTOMY     SPINE SURGERY     TONSILLECTOMY     TONSILLECTOMY     Patient Active Problem List   Diagnosis Date Noted   Multiple myeloma (HCC) 10/20/2022   Anxiety 10/20/2022   Irritable bowel syndrome with constipation 10/20/2022    Gastroesophageal reflux disease 10/20/2022   CINV (chemotherapy-induced nausea and vomiting) 10/20/2022   Immunocompromised (HCC) 10/20/2022   Chronic low back pain 10/20/2022   Polyneuropathy associated with underlying disease (HCC) 10/20/2022    PCP: Garnette Gunner, MD   REFERRING PROVIDER: Warrick Parisian, MD  REFERRING DIAG: Z94.84 H/o autologous stem cell transplant, G62.9 Peripheral neuropathy  THERAPY DIAG:  Muscle weakness (generalized)  Unsteadiness on feet  Repeated falls  Difficulty in walking, not elsewhere classified  Rationale for Evaluation and Treatment: Rehabilitation  ONSET DATE: December 2023  SUBJECTIVE:   SUBJECTIVE STATEMENT: I had stem cell transplant in December in hospital for 38 days, pretty much lost use of legs, had no PT in hospital till very end, came home with walker, then started using rollator which I liked, I had a couple of falls due to blood pressure dropping, now I use a cane but I forgot it today, I feel very unsteady mostly due to neuropathy in feet, just like to get stamina back and be able to do things I could do before all this.  I'm on 2 injections monthly, ones chemo and ones for my bones, just started getting  all my vaccines again.     PERTINENT HISTORY: Multiple myeloma, stem cell transplant December 2023,  polyneuropathy, chronic LBP, immunocompromised. GERD, IBS, osteopenia PAIN:  Are you having pain? No  PRECAUTIONS: Fall  WEIGHT BEARING RESTRICTIONS: No  FALLS:  Has patient fallen in last 6 months? Yes. Number of falls 5-6  LIVING ENVIRONMENT: Lives with: lives with their family Lives in: House/apartment Stairs: Yes: Internal: 14 steps; on left going up and to bonus room over garage, does not need to go up Has following equipment at home: Single point cane, Environmental consultant - 2 wheeled, Environmental consultant - 4 wheeled, Tour manager, and Grab bars  OCCUPATION: retired   PLOF: Independent  PATIENT GOALS: would like to improve stamina and  balance, walk around the neighborhood again, not fall  NEXT MD VISIT: next week and August for shots.   OBJECTIVE:   PATIENT SURVEYS:  LEFS 51/80= 63.75%  COGNITION: Overall cognitive status: Within functional limits for tasks assessed     SENSATION: Severe neuropathy both feet are numb, some neuropathy in right hand 4th and 5th fingers   POSTURE: rounded shoulders and forward head   LOWER EXTREMITY MMT:  MMT Right eval Left eval  Hip flexion 4+ 4+  Hip extension 5 (seated) 5 (seated)  Hip abduction 5 5  Hip adduction 5 5  Knee flexion 4+ 4+  Knee extension 4+ 4+  Ankle dorsiflexion 2+ 4  Ankle plantarflexion 5 (WB) 5 (WB)   (Blank rows = not tested)   FUNCTIONAL TESTS:  5 times sit to stand: 18.8 seconds with hands on thighs Timed up and go (TUG): 13.5 sec without AD, but unsteady; 16.5 sec with quad cane, more steady MCTSIB: Condition 1: Avg of 3 trials: 30 sec, Condition 2: Avg of 3 trials: 30 sec, Condition 3: Avg of 3 trials: 30 sec, Condition 4: Avg of 3 trials: 3 sec, and Total Score: 93/120 Tandem stance - unable, semitandem - 30 seconds, each side   GAIT: Distance walked: 53' Assistive device utilized: None Level of assistance: SBA Comments: very unsteady, decreased gait speed, decreased heel strike on R, visually slow gait speed.    TODAY'S TREATMENT:                                                                                                                              DATE:   10/22/22 Self Care: Findings, poc, safety recommended continue to use cane at home and not carry it to provide more proprioceptive input, discussed possible AFO for R foot drop.     PATIENT EDUCATION:  Education details: see self care.  Person educated: Patient and sister Education method: Explanation Education comprehension: verbalized understanding  HOME EXERCISE PROGRAM: TBD  ASSESSMENT:  CLINICAL IMPRESSION: Patient is a 67 y.o. female who was seen today for  physical therapy evaluation and treatment for deconditioning and imbalance after undergoing stem cell transplant in December 2023 for multiple myeloma.  She has severe neuropathy  in both feet from chemotherapy and has had multiple falls.  Anna Castillo  presents with physical impairments of impaired activity tolerance, impaired standing balance, impaired ambulation, and decreased safety awareness impacting safe and independent functional mobility. Examination revealed patient is at risk for falls and functional decline as evidenced by the following objective test measures: mCTSIB: position 1:  30 sec, position 2: 30sec, position 3: 30 sec, position 4: 3 sec (30sec in each position demonstrates equal weighting of balance systems), TUG of 16.5 sec (>12sec indicates increased risk for falls), and 5x sit to stand of 18.8 sec (>15sec indicates increased risk for falls and decreased BLE power). In addition she demonstrates R dorsiflexion weakness which places her at risk of catching foot and falling, and noted fatigued very quickly with standing and needed to take frequent rest breaks after standing for short periods (<1 min).  Patient will benefit from skilled physical therapy services to help reach the maximal level of functional independence and mobility. Pt demonstrates understanding of this plan of care and is in agreement with this plan.  .   OBJECTIVE IMPAIRMENTS: Abnormal gait, decreased activity tolerance, decreased balance, decreased endurance, decreased mobility, difficulty walking, decreased strength, decreased safety awareness, increased edema, impaired perceived functional ability, and impaired sensation.   ACTIVITY LIMITATIONS: carrying, lifting, bending, standing, squatting, stairs, and locomotion level  PARTICIPATION LIMITATIONS: meal prep, cleaning, laundry, driving, shopping, and community activity  PERSONAL FACTORS: Past/current experiences and 3+ comorbidities: Multiple myeloma, stem cell  transplant December 2023,  polyneuropathy, chronic LBP, immunocompromised. GERD, IBS, osteopenia  are also affecting patient's functional outcome.   REHAB POTENTIAL: Good  CLINICAL DECISION MAKING: Evolving/moderate complexity  EVALUATION COMPLEXITY: Moderate   GOALS: Goals reviewed with patient? Yes  SHORT TERM GOALS: Target date: 11/12/2022   Patient will be independent with initial HEP. Baseline: needs Goal status: INITIAL   LONG TERM GOALS: Target date: 12/31/2022   Patient will be independent with advanced/ongoing HEP to improve outcomes and carryover.  Baseline:  Goal status: INITIAL  2.  Patient will be able to ambulate 600' with LRAD with good safety to access community.  Baseline: fatigues quickly, R foot drop Goal status: INITIAL  3.  Patient will be able to step up/down curb safely with LRAD for safety with community ambulation.  Baseline: not safe Goal status: INITIAL   4.  Patient will demonstrate improved functional LE strength as demonstrated by 5x STS < 15 seconds without UE assist. Baseline: 18.8 sec with hands on thighs Goal status: INITIAL  5.  Patient will demonstrate at least 19/24 on DGI to improve gait stability and reduce risk for falls. Baseline: TBD Goal status: INITIAL  6.  Patient will score >49/56 on Berg Balance test to demonstrate safety without AD Baseline: TBD Goal status: INITIAL  7.  Patient will report 8 points improvement on LEFS to demonstrate improved functional ability. Baseline: 51/80 Goal status: INITIAL  8.  Patient will demonstrate gait speed of >0.8 m/s to be a safe community ambulator with decreased risk for recurrent falls.  Baseline: visually slow gait speed Goal status: INITIAL    PLAN:  PT FREQUENCY: 1-2x/week  PT DURATION: 10 weeks  PLANNED INTERVENTIONS: Therapeutic exercises, Therapeutic activity, Neuromuscular re-education, Balance training, Gait training, Patient/Family education, Self Care, Joint  mobilization, Electrical stimulation, Taping, Manual therapy, and Re-evaluation  PLAN FOR NEXT SESSION: do , DGI, review current HEP and progress as tolerated.   Following session do Berg.    Jena Gauss, PT 10/22/2022,  6:22 PM

## 2022-10-27 ENCOUNTER — Ambulatory Visit: Payer: Medicare HMO

## 2022-10-27 DIAGNOSIS — R200 Anesthesia of skin: Secondary | ICD-10-CM | POA: Diagnosis not present

## 2022-10-27 DIAGNOSIS — R296 Repeated falls: Secondary | ICD-10-CM

## 2022-10-27 DIAGNOSIS — G620 Drug-induced polyneuropathy: Secondary | ICD-10-CM | POA: Diagnosis not present

## 2022-10-27 DIAGNOSIS — R262 Difficulty in walking, not elsewhere classified: Secondary | ICD-10-CM | POA: Diagnosis not present

## 2022-10-27 DIAGNOSIS — R2681 Unsteadiness on feet: Secondary | ICD-10-CM | POA: Diagnosis not present

## 2022-10-27 DIAGNOSIS — M6281 Muscle weakness (generalized): Secondary | ICD-10-CM

## 2022-10-27 DIAGNOSIS — Z5112 Encounter for antineoplastic immunotherapy: Secondary | ICD-10-CM | POA: Diagnosis not present

## 2022-10-27 DIAGNOSIS — C9001 Multiple myeloma in remission: Secondary | ICD-10-CM | POA: Diagnosis not present

## 2022-10-27 NOTE — Therapy (Signed)
OUTPATIENT PHYSICAL THERAPY LOWER EXTREMITY EVALUATION   Patient Name: Anna Castillo MRN: [id number] DOB:[birth year], 67 y.o., female Today's Date: 10/27/2022  END OF SESSION:  PT End of Session - 10/27/22 1126     Visit Number 2    Date for PT Re-Evaluation 12/31/22    Authorization Type [employer] MCR    Progress Note Due on Visit 10    PT Start Time 1103    PT Stop Time 1147    PT Time Calculation (min) 44 min    Activity Tolerance Patient tolerated treatment well    Behavior During Therapy [facility] for tasks assessed/performed              Past Medical History:  Diagnosis Date   Anemia May 2023   Anxiety    Arthritis    Blood transfusion without reported diagnosis    Cancer (HCC)    Chronic back pain    Dizziness    GERD (gastroesophageal reflux disease)    Heart palpitations    Hypercholesterolemia    Hypertension    Lightheadedness    Multiple myeloma (HCC)    Neuromuscular disorder (HCC) Jan 2024   Peripheral Neuropathy   Obesity    PONV (postoperative nausea and vomiting)    Post-menopausal    at age 66   SOB (shortness of breath)    climbing stairs   Tachycardia    Past Surgical History:  Procedure Laterality Date   ABDOMINAL HYSTERECTOMY     BACK SURGERY     BREATH TEK H PYLORI N/A 05/02/2014   Procedure: BREATH TEK H PYLORI;  Surgeon: Ovidio Kin, MD;  Location: [facility];  Service: General;  Laterality: N/A;   CESAREAN SECTION     CESAREAN SECTION     CHOLECYSTECTOMY     colonscopy      LAPAROSCOPIC GASTRIC SLEEVE RESECTION N/A 06/27/2014   Procedure: LAPAROSCOPIC SLEEVE GASTRETOMY WITH UPPER ENDOSCOPY;  Surgeon: Ovidio Kin, MD;  Location: [facility];  Service: General;  Laterality: N/A;   PARTIAL HYSTERECTOMY     SPINE SURGERY     TONSILLECTOMY     TONSILLECTOMY     Patient Active Problem List   Diagnosis Date Noted   Multiple myeloma (HCC) 10/20/2022   Anxiety 10/20/2022   Irritable bowel syndrome with constipation 10/20/2022    Gastroesophageal reflux disease 10/20/2022   CINV (chemotherapy-induced nausea and vomiting) 10/20/2022   Immunocompromised (HCC) 10/20/2022   Chronic low back pain 10/20/2022   Polyneuropathy associated with underlying disease (HCC) 10/20/2022    PCP: Garnette Gunner, MD   REFERRING PROVIDER: Warrick Parisian, MD  REFERRING DIAG: Z94.84 H/o autologous stem cell transplant, G62.9 Peripheral neuropathy  THERAPY DIAG:  Muscle weakness (generalized)  Unsteadiness on feet  Repeated falls  Difficulty in walking, not elsewhere classified  Rationale for Evaluation and Treatment: Rehabilitation  ONSET DATE: December 2023  SUBJECTIVE:   SUBJECTIVE STATEMENT: I had stem cell transplant in December in hospital for 38 days, pretty much lost use of legs, had no PT in hospital till very end, came home with walker, then started using rollator which I liked, I had a couple of falls due to blood pressure dropping, now I use a cane but I forgot it today, I feel very unsteady mostly due to neuropathy in feet, just like to get stamina back and be able to do things I could do before all this.  I'm on 2 injections monthly, ones chemo and ones for my bones, just started  getting all my vaccines again.     PERTINENT HISTORY: Multiple myeloma, stem cell transplant December 2023,  polyneuropathy, chronic LBP, immunocompromised. GERD, IBS, osteopenia PAIN:  Are you having pain? No  PRECAUTIONS: Fall  WEIGHT BEARING RESTRICTIONS: No  FALLS:  Has patient fallen in last 6 months? Yes. Number of falls 5-6  LIVING ENVIRONMENT: Lives with: lives with their family Lives in: House/apartment Stairs: Yes: Internal: 14 steps; on left going up and to bonus room over garage, does not need to go up Has following equipment at home: Single point cane, Environmental consultant - 2 wheeled, Environmental consultant - 4 wheeled, Tour manager, and Grab bars  OCCUPATION: retired   PLOF: Independent  PATIENT GOALS: would like to improve stamina and  balance, walk around the neighborhood again, not fall  NEXT MD VISIT: next week and August for shots.   OBJECTIVE:   PATIENT SURVEYS:  LEFS 51/80= 63.75%  COGNITION: Overall cognitive status: Within functional limits for tasks assessed     SENSATION: Severe neuropathy both feet are numb, some neuropathy in right hand 4th and 5th fingers   POSTURE: rounded shoulders and forward head   LOWER EXTREMITY MMT:  MMT Right eval Left eval  Hip flexion 4+ 4+  Hip extension 5 (seated) 5 (seated)  Hip abduction 5 5  Hip adduction 5 5  Knee flexion 4+ 4+  Knee extension 4+ 4+  Ankle dorsiflexion 2+ 4  Ankle plantarflexion 5 (WB) 5 (WB)   (Blank rows = not tested)   FUNCTIONAL TESTS:  5 times sit to stand: 18.8 seconds with hands on thighs Timed up and go (TUG): 13.5 sec without AD, but unsteady; 16.5 sec with quad cane, more steady MCTSIB: Condition 1: Avg of 3 trials: 30 sec, Condition 2: Avg of 3 trials: 30 sec, Condition 3: Avg of 3 trials: 30 sec, Condition 4: Avg of 3 trials: 3 sec, and Total Score: 93/120 Tandem stance - unable, semitandem - 30 seconds, each side   GAIT: Distance walked: 93' Assistive device utilized: None Level of assistance: SBA Comments: very unsteady, decreased gait speed, decreased heel strike on R, visually slow gait speed.    TODAY'S TREATMENT:                                                                                                                              DATE:  10/27/22 : 800 ft in 6 min no rest required no SOB during test 19/24 on DGI Nustep L3x31min Standing heel raise 2x10 Runner stretch at counter 2x30" bil Tandem stance 2x30"  Sit to stands x 10 Standing hip abduction x 10 RTB at thighs  10/22/22 Self Care: Findings, poc, safety recommended continue to use cane at home and not carry it to provide more proprioceptive input, discussed possible AFO for R foot drop.     PATIENT EDUCATION:  Education details: HEP given   Person educated: Patient Education method: Explanation, Demonstration, and Handouts Education comprehension: verbalized  understanding  HOME EXERCISE PROGRAM: Access Code: [email or web address] URL: [email or web address] Date: 10/27/2022 Prepared by: Verta Ellen  Exercises - Sit to Stand  - 1 x daily - 7 x weekly - 2 sets - 10 reps - Standing Hip Abduction with Resistance at Ankles and Counter Support  - 1 x daily - 7 x weekly - 2 sets - 10 reps - Gastroc Stretch on Wall  - 2 x daily - 7 x weekly - 2 sets - 30 sec hold  ASSESSMENT:  CLINICAL IMPRESSION: Pt scored 19/24 on DGI, showing some risk for falls. She walked 850 ft with 6 MWT with no rest breaks and no SOB throughout the test. She used her SPC with both test. Initiated exercises for functional strengthening, flexibility and balance. Pt did become slightly SOB after tandem stance in conjunction with other exercises. Good tolerance for all other interventions. .   OBJECTIVE IMPAIRMENTS: Abnormal gait, decreased activity tolerance, decreased balance, decreased endurance, decreased mobility, difficulty walking, decreased strength, decreased safety awareness, increased edema, impaired perceived functional ability, and impaired sensation.   ACTIVITY LIMITATIONS: carrying, lifting, bending, standing, squatting, stairs, and locomotion level  PARTICIPATION LIMITATIONS: meal prep, cleaning, laundry, driving, shopping, and community activity  PERSONAL FACTORS: Past/current experiences and 3+ comorbidities: Multiple myeloma, stem cell transplant December 2023,  polyneuropathy, chronic LBP, immunocompromised. GERD, IBS, osteopenia  are also affecting patient's functional outcome.   REHAB POTENTIAL: Good  CLINICAL DECISION MAKING: Evolving/moderate complexity  EVALUATION COMPLEXITY: Moderate   GOALS: Goals reviewed with patient? Yes  SHORT TERM GOALS: Target date: 11/12/2022   Patient will be independent with initial  HEP. Baseline: needs Goal status: IN PROGRESS   LONG TERM GOALS: Target date: 12/31/2022   Patient will be independent with advanced/ongoing HEP to improve outcomes and carryover.  Baseline:  Goal status: IN PROGRESS  2.  Patient will be able to ambulate 600' with LRAD with good safety to access community.  Baseline: fatigues quickly, R foot drop Goal status: IN PROGRESS  3.  Patient will be able to step up/down curb safely with LRAD for safety with community ambulation.  Baseline: not safe Goal status: IN PROGRESS   4.  Patient will demonstrate improved functional LE strength as demonstrated by 5x STS < 15 seconds without UE assist. Baseline: 18.8 sec with hands on thighs Goal status: IN PROGRESS  5.  Patient will demonstrate at least 19/24 on DGI to improve gait stability and reduce risk for falls. Baseline: TBD Goal status: IN PROGRESS  6.  Patient will score >49/56 on Berg Balance test to demonstrate safety without AD Baseline: TBD Goal status: IN PROGRESS  7.  Patient will report 8 points improvement on LEFS to demonstrate improved functional ability. Baseline: 51/80 Goal status: IN PROGRESS  8.  Patient will demonstrate gait speed of >0.8 m/s to be a safe community ambulator with decreased risk for recurrent falls.  Baseline: visually slow gait speed Goal status: IN PROGRESS    PLAN:  PT FREQUENCY: 1-2x/week  PT DURATION: 10 weeks  PLANNED INTERVENTIONS: Therapeutic exercises, Therapeutic activity, Neuromuscular re-education, Balance training, Gait training, Patient/Family education, Self Care, Joint mobilization, Electrical stimulation, Taping, Manual therapy, and Re-evaluation  PLAN FOR NEXT SESSION: try DGI w/o AD? Balance activities- tandem, narrow BOS interventions;  Following session do Berg.    Darleene Cleaver, PTA 10/27/2022, 12:03 PM

## 2022-10-28 NOTE — Therapy (Signed)
OUTPATIENT PHYSICAL THERAPY LOWER EXTREMITY EVALUATION   Patient Name: Anna Castillo MRN: [id number] DOB:Sep 28, 1955, 67 y.o., female Today's Date: 10/28/2022  END OF SESSION:    Past Medical History:  Diagnosis Date   Anemia May 2023   Anxiety    Arthritis    Blood transfusion without reported diagnosis    Cancer (HCC)    Chronic back pain    Dizziness    GERD (gastroesophageal reflux disease)    Heart palpitations    Hypercholesterolemia    Hypertension    Lightheadedness    Multiple myeloma (HCC)    Neuromuscular disorder (HCC) Jan 2024   Peripheral Neuropathy   Obesity    PONV (postoperative nausea and vomiting)    Post-menopausal    at age 2   SOB (shortness of breath)    climbing stairs   Tachycardia    Past Surgical History:  Procedure Laterality Date   ABDOMINAL HYSTERECTOMY     BACK SURGERY     BREATH TEK H PYLORI N/A 05/02/2014   Procedure: BREATH TEK H PYLORI;  Surgeon: Ovidio Kin, MD;  Location: [facility];  Service: General;  Laterality: N/A;   CESAREAN SECTION     CESAREAN SECTION     CHOLECYSTECTOMY     colonscopy      LAPAROSCOPIC GASTRIC SLEEVE RESECTION N/A 06/27/2014   Procedure: LAPAROSCOPIC SLEEVE GASTRETOMY WITH UPPER ENDOSCOPY;  Surgeon: Ovidio Kin, MD;  Location: [facility];  Service: General;  Laterality: N/A;   PARTIAL HYSTERECTOMY     SPINE SURGERY     TONSILLECTOMY     TONSILLECTOMY     Patient Active Problem List   Diagnosis Date Noted   Multiple myeloma (HCC) 10/20/2022   Anxiety 10/20/2022   Irritable bowel syndrome with constipation 10/20/2022   Gastroesophageal reflux disease 10/20/2022   CINV (chemotherapy-induced nausea and vomiting) 10/20/2022   Immunocompromised (HCC) 10/20/2022   Chronic low back pain 10/20/2022   Polyneuropathy associated with underlying disease (HCC) 10/20/2022    PCP: Garnette Gunner, MD   REFERRING PROVIDER: Warrick Parisian, MD  REFERRING DIAG: Z94.84 H/o autologous stem cell  transplant, G62.9 Peripheral neuropathy  THERAPY DIAG:  Muscle weakness (generalized)  Unsteadiness on feet  Repeated falls  Difficulty in walking, not elsewhere classified  Rationale for Evaluation and Treatment: Rehabilitation  ONSET DATE: December 2023  SUBJECTIVE:   SUBJECTIVE STATEMENT: I had stem cell transplant in December in hospital for 38 days, pretty much lost use of legs, had no PT in hospital till very end, came home with walker, then started using rollator which I liked, I had a couple of falls due to blood pressure dropping, now I use a cane but I forgot it today, I feel very unsteady mostly due to neuropathy in feet, just like to get stamina back and be able to do things I could do before all this.  I'm on 2 injections monthly, ones chemo and ones for my bones, just started getting all my vaccines again.     PERTINENT HISTORY: Multiple myeloma, stem cell transplant December 2023,  polyneuropathy, chronic LBP, immunocompromised. GERD, IBS, osteopenia PAIN:  Are you having pain? No  PRECAUTIONS: Fall  WEIGHT BEARING RESTRICTIONS: No  FALLS:  Has patient fallen in last 6 months? Yes. Number of falls 5-6  LIVING ENVIRONMENT: Lives with: lives with their family Lives in: House/apartment Stairs: Yes: Internal: 14 steps; on left going up and to bonus room over garage, does not need to go up Has following  equipment at home: Single point cane, Environmental consultant - 2 wheeled, Environmental consultant - 4 wheeled, Tour manager, and Grab bars  OCCUPATION: retired   PLOF: Independent  PATIENT GOALS: would like to improve stamina and balance, walk around the neighborhood again, not fall  NEXT MD VISIT: next week and August for shots.   OBJECTIVE:   PATIENT SURVEYS:  LEFS 51/80= 63.75%  COGNITION: Overall cognitive status: Within functional limits for tasks assessed     SENSATION: Severe neuropathy both feet are numb, some neuropathy in right hand 4th and 5th fingers   POSTURE: rounded  shoulders and forward head   LOWER EXTREMITY MMT:  MMT Right eval Left eval  Hip flexion 4+ 4+  Hip extension 5 (seated) 5 (seated)  Hip abduction 5 5  Hip adduction 5 5  Knee flexion 4+ 4+  Knee extension 4+ 4+  Ankle dorsiflexion 2+ 4  Ankle plantarflexion 5 (WB) 5 (WB)   (Blank rows = not tested)   FUNCTIONAL TESTS:  5 times sit to stand: 18.8 seconds with hands on thighs Timed up and go (TUG): 13.5 sec without AD, but unsteady; 16.5 sec with quad cane, more steady MCTSIB: Condition 1: Avg of 3 trials: 30 sec, Condition 2: Avg of 3 trials: 30 sec, Condition 3: Avg of 3 trials: 30 sec, Condition 4: Avg of 3 trials: 3 sec, and Total Score: 93/120 Tandem stance - unable, semitandem - 30 seconds, each side   GAIT: Distance walked: 47' Assistive device utilized: None Level of assistance: SBA Comments: very unsteady, decreased gait speed, decreased heel strike on R, visually slow gait speed.    TODAY'S TREATMENT:                                                                                                                              DATE:   10/22/22 Self Care: Findings, poc, safety recommended continue to use cane at home and not carry it to provide more proprioceptive input, discussed possible AFO for R foot drop.     PATIENT EDUCATION:  Education details: see self care.  Person educated: Patient and sister Education method: Explanation Education comprehension: verbalized understanding  HOME EXERCISE PROGRAM: TBD  ASSESSMENT:  CLINICAL IMPRESSION: Patient is a 67 y.o. female who was seen today for physical therapy evaluation and treatment for deconditioning and imbalance after undergoing stem cell transplant in December 2023 for multiple myeloma.  She has severe neuropathy in both feet from chemotherapy and has had multiple falls.  Anna Castillo  presents with physical impairments of impaired activity tolerance, impaired standing balance, impaired ambulation, and  decreased safety awareness impacting safe and independent functional mobility. Examination revealed patient is at risk for falls and functional decline as evidenced by the following objective test measures: mCTSIB: position 1:  30 sec, position 2: 30sec, position 3: 30 sec, position 4: 3 sec (30sec in each position demonstrates equal weighting of balance systems), TUG of 16.5 sec (>12sec indicates  increased risk for falls), and 5x sit to stand of 18.8 sec (>15sec indicates increased risk for falls and decreased BLE power). In addition she demonstrates R dorsiflexion weakness which places her at risk of catching foot and falling, and noted fatigued very quickly with standing and needed to take frequent rest breaks after standing for short periods (<1 min).  Patient will benefit from skilled physical therapy services to help reach the maximal level of functional independence and mobility. Pt demonstrates understanding of this plan of care and is in agreement with this plan.  .   OBJECTIVE IMPAIRMENTS: Abnormal gait, decreased activity tolerance, decreased balance, decreased endurance, decreased mobility, difficulty walking, decreased strength, decreased safety awareness, increased edema, impaired perceived functional ability, and impaired sensation.   ACTIVITY LIMITATIONS: carrying, lifting, bending, standing, squatting, stairs, and locomotion level  PARTICIPATION LIMITATIONS: meal prep, cleaning, laundry, driving, shopping, and community activity  PERSONAL FACTORS: Past/current experiences and 3+ comorbidities: Multiple myeloma, stem cell transplant December 2023,  polyneuropathy, chronic LBP, immunocompromised. GERD, IBS, osteopenia  are also affecting patient's functional outcome.   REHAB POTENTIAL: Good  CLINICAL DECISION MAKING: Evolving/moderate complexity  EVALUATION COMPLEXITY: Moderate   GOALS: Goals reviewed with patient? Yes  SHORT TERM GOALS: Target date: 11/12/2022   Patient will be  independent with initial HEP. Baseline: needs Goal status: INITIAL   LONG TERM GOALS: Target date: 12/31/2022   Patient will be independent with advanced/ongoing HEP to improve outcomes and carryover.  Baseline:  Goal status: INITIAL  2.  Patient will be able to ambulate 600' with LRAD with good safety to access community.  Baseline: fatigues quickly, R foot drop Goal status: INITIAL  3.  Patient will be able to step up/down curb safely with LRAD for safety with community ambulation.  Baseline: not safe Goal status: INITIAL   4.  Patient will demonstrate improved functional LE strength as demonstrated by 5x STS < 15 seconds without UE assist. Baseline: 18.8 sec with hands on thighs Goal status: INITIAL  5.  Patient will demonstrate at least 19/24 on DGI to improve gait stability and reduce risk for falls. Baseline: TBD Goal status: INITIAL  6.  Patient will score >49/56 on Berg Balance test to demonstrate safety without AD Baseline: TBD Goal status: INITIAL  7.  Patient will report 8 points improvement on LEFS to demonstrate improved functional ability. Baseline: 51/80 Goal status: INITIAL  8.  Patient will demonstrate gait speed of >0.8 m/s to be a safe community ambulator with decreased risk for recurrent falls.  Baseline: visually slow gait speed Goal status: INITIAL    PLAN:  PT FREQUENCY: 1-2x/week  PT DURATION: 10 weeks  PLANNED INTERVENTIONS: Therapeutic exercises, Therapeutic activity, Neuromuscular re-education, Balance training, Gait training, Patient/Family education, Self Care, Joint mobilization, Electrical stimulation, Taping, Manual therapy, and Re-evaluation  PLAN FOR NEXT SESSION: do , DGI, review current HEP and progress as tolerated.   Following session do Berg.    Jena Gauss, PT 10/28/2022, 11:08 AM

## 2022-10-28 NOTE — Addendum Note (Signed)
Addended by: Jena Gauss on: 10/28/2022 11:11 AM   Modules accepted: Orders

## 2022-10-29 ENCOUNTER — Ambulatory Visit: Payer: Medicare HMO | Admitting: Physical Therapy

## 2022-10-29 DIAGNOSIS — R2681 Unsteadiness on feet: Secondary | ICD-10-CM | POA: Diagnosis not present

## 2022-10-29 DIAGNOSIS — M6281 Muscle weakness (generalized): Secondary | ICD-10-CM

## 2022-10-29 DIAGNOSIS — R262 Difficulty in walking, not elsewhere classified: Secondary | ICD-10-CM | POA: Diagnosis not present

## 2022-10-29 DIAGNOSIS — R296 Repeated falls: Secondary | ICD-10-CM | POA: Diagnosis not present

## 2022-10-29 NOTE — Therapy (Signed)
OUTPATIENT PHYSICAL THERAPY TREATMENT   Patient Name: Anna Castillo MRN: [id number] DOB:Dec 28, 1955, 67 y.o., female Today's Date: 10/29/2022  END OF SESSION:  PT End of Session - 10/29/22 1100     Visit Number 3    Date for PT Re-Evaluation 12/31/22    Authorization Type [employer] MCR    Progress Note Due on Visit 10    PT Start Time 1100    PT Stop Time 1146    PT Time Calculation (min) 46 min    Activity Tolerance Patient tolerated treatment well    Behavior During Therapy [facility] for tasks assessed/performed              Past Medical History:  Diagnosis Date   Anemia May 2023   Anxiety    Arthritis    Blood transfusion without reported diagnosis    Cancer (HCC)    Chronic back pain    Dizziness    GERD (gastroesophageal reflux disease)    Heart palpitations    Hypercholesterolemia    Hypertension    Lightheadedness    Multiple myeloma (HCC)    Neuromuscular disorder (HCC) Jan 2024   Peripheral Neuropathy   Obesity    PONV (postoperative nausea and vomiting)    Post-menopausal    at age 52   SOB (shortness of breath)    climbing stairs   Tachycardia    Past Surgical History:  Procedure Laterality Date   ABDOMINAL HYSTERECTOMY     BACK SURGERY     BREATH TEK H PYLORI N/A 05/02/2014   Procedure: BREATH TEK Richardo Priest;  Surgeon: Ovidio Kin, MD;  Location: [facility];  Service: General;  Laterality: N/A;   CESAREAN SECTION     CESAREAN SECTION     CHOLECYSTECTOMY     colonscopy      LAPAROSCOPIC GASTRIC SLEEVE RESECTION N/A 06/27/2014   Procedure: LAPAROSCOPIC SLEEVE GASTRETOMY WITH UPPER ENDOSCOPY;  Surgeon: Ovidio Kin, MD;  Location: [facility];  Service: General;  Laterality: N/A;   PARTIAL HYSTERECTOMY     SPINE SURGERY     TONSILLECTOMY     TONSILLECTOMY     Patient Active Problem List   Diagnosis Date Noted   Multiple myeloma (HCC) 10/20/2022   Anxiety 10/20/2022   Irritable bowel syndrome with constipation 10/20/2022   Gastroesophageal reflux  disease 10/20/2022   CINV (chemotherapy-induced nausea and vomiting) 10/20/2022   Immunocompromised (HCC) 10/20/2022   Chronic low back pain 10/20/2022   Polyneuropathy associated with underlying disease (HCC) 10/20/2022    PCP: Garnette Gunner, MD   REFERRING PROVIDER: Warrick Parisian, MD  REFERRING DIAG: Z94.84 H/o autologous stem cell transplant, G62.9 Peripheral neuropathy  THERAPY DIAG:  Muscle weakness (generalized)  Unsteadiness on feet  Repeated falls  Difficulty in walking, not elsewhere classified  Rationale for Evaluation and Treatment: Rehabilitation  ONSET DATE: December 2023  SUBJECTIVE:   SUBJECTIVE STATEMENT: Doing well, no pain, no new falls.   PERTINENT HISTORY: Multiple myeloma, stem cell transplant December 2023,  polyneuropathy, chronic LBP, immunocompromised. GERD, IBS, osteopenia PAIN:  Are you having pain? No  PRECAUTIONS: Fall  WEIGHT BEARING RESTRICTIONS: No  FALLS:  Has patient fallen in last 6 months? Yes. Number of falls 5-6  LIVING ENVIRONMENT: Lives with: lives with their family Lives in: House/apartment Stairs: Yes: Internal: 14 steps; on left going up and to bonus room over garage, does not need to go up Has following equipment at home: Single point cane, Walker - 2 wheeled, Environmental consultant -  4 wheeled, Tour manager, and Grab bars  OCCUPATION: retired   PLOF: Independent  PATIENT GOALS: would like to improve stamina and balance, walk around the neighborhood again, not fall  NEXT MD VISIT: next week and August for shots.   OBJECTIVE:   PATIENT SURVEYS:  LEFS 51/80= 63.75%  COGNITION: Overall cognitive status: Within functional limits for tasks assessed     SENSATION: Severe neuropathy both feet are numb, some neuropathy in right hand 4th and 5th fingers   POSTURE: rounded shoulders and forward head   LOWER EXTREMITY MMT:  MMT Right eval Left eval  Hip flexion 4+ 4+  Hip extension 5 (seated) 5 (seated)  Hip abduction 5  5  Hip adduction 5 5  Knee flexion 4+ 4+  Knee extension 4+ 4+  Ankle dorsiflexion 2+ 4  Ankle plantarflexion 5 (WB) 5 (WB)   (Blank rows = not tested)   FUNCTIONAL TESTS:  5 times sit to stand: 18.8 seconds with hands on thighs Timed up and go (TUG): 13.5 sec without AD, but unsteady; 16.5 sec with quad cane, more steady MCTSIB: Condition 1: Avg of 3 trials: 30 sec, Condition 2: Avg of 3 trials: 30 sec, Condition 3: Avg of 3 trials: 30 sec, Condition 4: Avg of 3 trials: 3 sec, and Total Score: 93/120 Tandem stance - unable, semitandem - 30 seconds, each side   GAIT: Distance walked: 50' Assistive device utilized: None Level of assistance: SBA Comments: very unsteady, decreased gait speed, decreased heel strike on R, visually slow gait speed.    TODAY'S TREATMENT:                                                                                                                              DATE:  10/29/22 Therapeutic Exercise: to improve strength and mobility.  Demo, verbal and tactile cues throughout for technique. Nustep L5 x 6 min  Seated toe raises x 10  At counter -heel raises x 15 -hip abduction 2 x 10 -hip extension 2 x 10 -hip flexion 2 x 10 -tandem balance x 15 sec each  In corner for safety - tandem balance  Therapeutic Activity:  to assess balance Berg 43/56   10/27/22 : 800 ft in 6 min no rest required no SOB during test 19/24 on DGI Nustep L3x50min Standing heel raise 2x10 Runner stretch at counter 2x30" bil Tandem stance 2x30"  Sit to stands x 10 Standing hip abduction x 10 RTB at thighs  10/22/22 Self Care: Findings, poc, safety recommended continue to use cane at home and not carry it to provide more proprioceptive input, discussed possible AFO for R foot drop.     PATIENT EDUCATION:  Education details: HEP update  Person educated: Patient Education method: Explanation, Demonstration, and Handouts Education comprehension: verbalized  understanding  HOME EXERCISE PROGRAM: Access Code: [email or web address] URL: [email or web address] Date: 10/27/2022 Prepared by: Verta Ellen  Exercises - Sit to Stand  - 1 x daily -  7 x weekly - 2 sets - 10 reps - Standing Hip Abduction with Resistance at Ankles and Counter Support  - 1 x daily - 7 x weekly - 2 sets - 10 reps - Gastroc Stretch on Wall  - 2 x daily - 7 x weekly - 2 sets - 30 sec hold  ASSESSMENT:  CLINICAL IMPRESSION: Anna Castillo reports no new concerns today.  She scored 43/56 on Berg demonstrating moderate fall risk and indicating need for AD.  She is using her quad cane consistently now.  Reviewed and updated HEP for strengthening, also demonstrated tandem stance in corner for safety.  Also reviewed toe up braces on [employer] that would proved gentle DF assist to prevent catching R foot when fatigued.  Anna Castillo continues to demonstrate potential for improvement and would benefit from continued skilled therapy to address impairments.      OBJECTIVE IMPAIRMENTS: Abnormal gait, decreased activity tolerance, decreased balance, decreased endurance, decreased mobility, difficulty walking, decreased strength, decreased safety awareness, increased edema, impaired perceived functional ability, and impaired sensation.   ACTIVITY LIMITATIONS: carrying, lifting, bending, standing, squatting, stairs, and locomotion level  PARTICIPATION LIMITATIONS: meal prep, cleaning, laundry, driving, shopping, and community activity  PERSONAL FACTORS: Past/current experiences and 3+ comorbidities: Multiple myeloma, stem cell transplant December 2023,  polyneuropathy, chronic LBP, immunocompromised. GERD, IBS, osteopenia  are also affecting patient's functional outcome.   REHAB POTENTIAL: Good  CLINICAL DECISION MAKING: Evolving/moderate complexity  EVALUATION COMPLEXITY: Moderate   GOALS: Goals reviewed with patient? Yes  SHORT TERM GOALS: Target date: 11/12/2022    Patient will be independent with initial HEP. Baseline: needs Goal status: IN PROGRESS   LONG TERM GOALS: Target date: 12/31/2022   Patient will be independent with advanced/ongoing HEP to improve outcomes and carryover.  Baseline:  Goal status: IN PROGRESS  2.  Patient will be able to ambulate 600' with LRAD with good safety to access community.  Baseline: fatigues quickly, R foot drop Goal status: IN PROGRESS  3.  Patient will be able to step up/down curb safely with LRAD for safety with community ambulation.  Baseline: not safe Goal status: IN PROGRESS   4.  Patient will demonstrate improved functional LE strength as demonstrated by 5x STS < 15 seconds without UE assist. Baseline: 18.8 sec with hands on thighs Goal status: IN PROGRESS  5.  Patient will demonstrate at least 19/24 on DGI to improve gait stability and reduce risk for falls. Baseline: 19/24 with AD Goal status: MET  6.  Patient will score >49/56 on Berg Balance test to demonstrate safety without AD Baseline: 43/56 Goal status: IN PROGRESS  7.  Patient will report 8 points improvement on LEFS to demonstrate improved functional ability. Baseline: 51/80 Goal status: IN PROGRESS  8.  Patient will demonstrate gait speed of >0.8 m/s to be a safe community ambulator with decreased risk for recurrent falls.  Baseline: visually slow gait speed Goal status: IN PROGRESS    PLAN:  PT FREQUENCY: 1-2x/week  PT DURATION: 10 weeks  PLANNED INTERVENTIONS: Therapeutic exercises, Therapeutic activity, Neuromuscular re-education, Balance training, Gait training, Patient/Family education, Self Care, Joint mobilization, Electrical stimulation, Taping, Manual therapy, and Re-evaluation  PLAN FOR NEXT SESSION: Continue LE strengthening and balance activities.    Jena Gauss, PT 10/29/2022, 11:54 AM

## 2022-11-03 ENCOUNTER — Ambulatory Visit: Payer: Medicare HMO

## 2022-11-03 DIAGNOSIS — R296 Repeated falls: Secondary | ICD-10-CM | POA: Diagnosis not present

## 2022-11-03 DIAGNOSIS — R262 Difficulty in walking, not elsewhere classified: Secondary | ICD-10-CM | POA: Diagnosis not present

## 2022-11-03 DIAGNOSIS — M6281 Muscle weakness (generalized): Secondary | ICD-10-CM | POA: Diagnosis not present

## 2022-11-03 DIAGNOSIS — R2681 Unsteadiness on feet: Secondary | ICD-10-CM | POA: Diagnosis not present

## 2022-11-03 NOTE — Therapy (Signed)
OUTPATIENT PHYSICAL THERAPY TREATMENT   Patient Name: Anna Castillo MRN: [id number] DOB:01-Sep-1955, 67 y.o., female Today's Date: 11/03/2022  END OF SESSION:  PT End of Session - 11/03/22 1120     Visit Number 4    Date for PT Re-Evaluation 12/31/22    Authorization Type [employer] MCR    Progress Note Due on Visit 10    PT Start Time 1103    PT Stop Time 1145    PT Time Calculation (min) 42 min    Activity Tolerance Patient tolerated treatment well    Behavior During Therapy [facility] for tasks assessed/performed               Past Medical History:  Diagnosis Date   Anemia May 2023   Anxiety    Arthritis    Blood transfusion without reported diagnosis    Cancer (HCC)    Chronic back pain    Dizziness    GERD (gastroesophageal reflux disease)    Heart palpitations    Hypercholesterolemia    Hypertension    Lightheadedness    Multiple myeloma (HCC)    Neuromuscular disorder (HCC) Jan 2024   Peripheral Neuropathy   Obesity    PONV (postoperative nausea and vomiting)    Post-menopausal    at age 62   SOB (shortness of breath)    climbing stairs   Tachycardia    Past Surgical History:  Procedure Laterality Date   ABDOMINAL HYSTERECTOMY     BACK SURGERY     BREATH TEK H PYLORI N/A 05/02/2014   Procedure: BREATH TEK Richardo Priest;  Surgeon: Ovidio Kin, MD;  Location: [facility];  Service: General;  Laterality: N/A;   CESAREAN SECTION     CESAREAN SECTION     CHOLECYSTECTOMY     colonscopy      LAPAROSCOPIC GASTRIC SLEEVE RESECTION N/A 06/27/2014   Procedure: LAPAROSCOPIC SLEEVE GASTRETOMY WITH UPPER ENDOSCOPY;  Surgeon: Ovidio Kin, MD;  Location: [facility];  Service: General;  Laterality: N/A;   PARTIAL HYSTERECTOMY     SPINE SURGERY     TONSILLECTOMY     TONSILLECTOMY     Patient Active Problem List   Diagnosis Date Noted   Multiple myeloma (HCC) 10/20/2022   Anxiety 10/20/2022   Irritable bowel syndrome with constipation 10/20/2022   Gastroesophageal  reflux disease 10/20/2022   CINV (chemotherapy-induced nausea and vomiting) 10/20/2022   Immunocompromised (HCC) 10/20/2022   Chronic low back pain 10/20/2022   Polyneuropathy associated with underlying disease (HCC) 10/20/2022    PCP: Garnette Gunner, MD   REFERRING PROVIDER: Warrick Parisian, MD  REFERRING DIAG: Z94.84 H/o autologous stem cell transplant, G62.9 Peripheral neuropathy  THERAPY DIAG:  Muscle weakness (generalized)  Unsteadiness on feet  Repeated falls  Difficulty in walking, not elsewhere classified  Rationale for Evaluation and Treatment: Rehabilitation  ONSET DATE: December 2023  SUBJECTIVE:   SUBJECTIVE STATEMENT: Doing well,no new issues.  PERTINENT HISTORY: Multiple myeloma, stem cell transplant December 2023,  polyneuropathy, chronic LBP, immunocompromised. GERD, IBS, osteopenia PAIN:  Are you having pain? No  PRECAUTIONS: Fall  WEIGHT BEARING RESTRICTIONS: No  FALLS:  Has patient fallen in last 6 months? Yes. Number of falls 5-6  LIVING ENVIRONMENT: Lives with: lives with their family Lives in: House/apartment Stairs: Yes: Internal: 14 steps; on left going up and to bonus room over garage, does not need to go up Has following equipment at home: Single point cane, Walker - 2 wheeled, Environmental consultant - 4 wheeled, [employer]  bench, and Grab bars  OCCUPATION: retired   PLOF: Independent  PATIENT GOALS: would like to improve stamina and balance, walk around the neighborhood again, not fall  NEXT MD VISIT: next week and August for shots.   OBJECTIVE:   PATIENT SURVEYS:  LEFS 51/80= 63.75%  COGNITION: Overall cognitive status: Within functional limits for tasks assessed     SENSATION: Severe neuropathy both feet are numb, some neuropathy in right hand 4th and 5th fingers   POSTURE: rounded shoulders and forward head   LOWER EXTREMITY MMT:  MMT Right eval Left eval  Hip flexion 4+ 4+  Hip extension 5 (seated) 5 (seated)  Hip abduction 5 5   Hip adduction 5 5  Knee flexion 4+ 4+  Knee extension 4+ 4+  Ankle dorsiflexion 2+ 4  Ankle plantarflexion 5 (WB) 5 (WB)   (Blank rows = not tested)   FUNCTIONAL TESTS:  5 times sit to stand: 18.8 seconds with hands on thighs Timed up and go (TUG): 13.5 sec without AD, but unsteady; 16.5 sec with quad cane, more steady MCTSIB: Condition 1: Avg of 3 trials: 30 sec, Condition 2: Avg of 3 trials: 30 sec, Condition 3: Avg of 3 trials: 30 sec, Condition 4: Avg of 3 trials: 3 sec, and Total Score: 93/120 Tandem stance - unable, semitandem - 30 seconds, each side   GAIT: Distance walked: 35' Assistive device utilized: None Level of assistance: SBA Comments: very unsteady, decreased gait speed, decreased heel strike on R, visually slow gait speed.    TODAY'S TREATMENT:                                                                                                                              DATE:  11/03/22 Therapeutic Exercise: to improve strength and mobility.  Demo, verbal and tactile cues throughout for technique. Seated LAQ no weight x 20 for warm up Gait 3 laps (270) ft no AD for warm up Seated R ankle DF x 20 Runner stretch 2x30 sec bil Standing heel raise 2x15 Hip hike from 2' book 2x10 bil  Nuero Re-ed: Tandem stance x 20 sec switching foot position Unilateral march x 5 bil intermittent UE support Step up and over small bean bag 2x5 bil- more sway and difficulty standing on RLE   10/29/22 Therapeutic Exercise: to improve strength and mobility.  Demo, verbal and tactile cues throughout for technique. Nustep L5 x 6 min  Seated toe raises x 10  At counter -heel raises x 15 -hip abduction 2 x 10 -hip extension 2 x 10 -hip flexion 2 x 10 -tandem balance x 15 sec each  In corner for safety - tandem balance  Therapeutic Activity:  to assess balance Berg 43/56   10/27/22 : 800 ft in 6 min no rest required no SOB during test 19/24 on DGI Nustep L3x20min Standing heel  raise 2x10 Runner stretch at counter 2x30" bil Tandem stance 2x30"  Sit to stands  x 10 Standing hip abduction x 10 RTB at thighs  10/22/22 Self Care: Findings, poc, safety recommended continue to use cane at home and not carry it to provide more proprioceptive input, discussed possible AFO for R foot drop.     PATIENT EDUCATION:  Education details: HEP update  Person educated: Patient Education method: Explanation, Demonstration, and Handouts Education comprehension: verbalized understanding  HOME EXERCISE PROGRAM: Access Code: [email or web address] URL: [email or web address] Date: 10/27/2022 Prepared by: Verta Ellen  Exercises - Sit to Stand  - 1 x daily - 7 x weekly - 2 sets - 10 reps - Standing Hip Abduction with Resistance at Ankles and Counter Support  - 1 x daily - 7 x weekly - 2 sets - 10 reps - Gastroc Stretch on Wall  - 2 x daily - 7 x weekly - 2 sets - 30 sec hold  ASSESSMENT:  CLINICAL IMPRESSION: Continued with progression of strengthening and balance activities to improve function. Pt continues to show hip flexor compensation to clear R foot on swing phase. Good muscle contraction palpated with seated ankle DF but very limited ROM from weakness. CGA required with all balance activities, more weakness and swaying noted with standing on R LE. Pt able to complete all interventions with no increase in pain.  Delight Bickle Byrnes continues to demonstrate potential for improvement and would benefit from continued skilled therapy to address impairments.      OBJECTIVE IMPAIRMENTS: Abnormal gait, decreased activity tolerance, decreased balance, decreased endurance, decreased mobility, difficulty walking, decreased strength, decreased safety awareness, increased edema, impaired perceived functional ability, and impaired sensation.   ACTIVITY LIMITATIONS: carrying, lifting, bending, standing, squatting, stairs, and locomotion level  PARTICIPATION LIMITATIONS: meal prep,  cleaning, laundry, driving, shopping, and community activity  PERSONAL FACTORS: Past/current experiences and 3+ comorbidities: Multiple myeloma, stem cell transplant December 2023,  polyneuropathy, chronic LBP, immunocompromised. GERD, IBS, osteopenia  are also affecting patient's functional outcome.   REHAB POTENTIAL: Good  CLINICAL DECISION MAKING: Evolving/moderate complexity  EVALUATION COMPLEXITY: Moderate   GOALS: Goals reviewed with patient? Yes  SHORT TERM GOALS: Target date: 11/12/2022   Patient will be independent with initial HEP. Baseline: needs Goal status: MET- 11/03/22   LONG TERM GOALS: Target date: 12/31/2022   Patient will be independent with advanced/ongoing HEP to improve outcomes and carryover.  Baseline:  Goal status: IN PROGRESS  2.  Patient will be able to ambulate 600' with LRAD with good safety to access community.  Baseline: fatigues quickly, R foot drop Goal status: IN PROGRESS  3.  Patient will be able to step up/down curb safely with LRAD for safety with community ambulation.  Baseline: not safe Goal status: IN PROGRESS   4.  Patient will demonstrate improved functional LE strength as demonstrated by 5x STS < 15 seconds without UE assist. Baseline: 18.8 sec with hands on thighs Goal status: IN PROGRESS  5.  Patient will demonstrate at least 19/24 on DGI to improve gait stability and reduce risk for falls. Baseline: 19/24 with AD Goal status: MET  6.  Patient will score >49/56 on Berg Balance test to demonstrate safety without AD Baseline: 43/56 Goal status: IN PROGRESS  7.  Patient will report 8 points improvement on LEFS to demonstrate improved functional ability. Baseline: 51/80 Goal status: IN PROGRESS  8.  Patient will demonstrate gait speed of >0.8 m/s to be a safe community ambulator with decreased risk for recurrent falls.  Baseline: visually slow gait speed Goal status: IN PROGRESS    PLAN:  PT FREQUENCY: 1-2x/week  PT  DURATION: 10 weeks  PLANNED INTERVENTIONS: Therapeutic exercises, Therapeutic activity, Neuromuscular re-education, Balance training, Gait training, Patient/Family education, Self Care, Joint mobilization, Electrical stimulation, Taping, Manual therapy, and Re-evaluation  PLAN FOR NEXT SESSION: Continue LE strengthening and balance activities.    Darleene Cleaver, PTA 11/03/2022, 11:58 AM

## 2022-11-05 ENCOUNTER — Encounter: Payer: Self-pay | Admitting: Physical Therapy

## 2022-11-05 ENCOUNTER — Ambulatory Visit: Payer: Medicare HMO | Admitting: Physical Therapy

## 2022-11-05 DIAGNOSIS — R2681 Unsteadiness on feet: Secondary | ICD-10-CM

## 2022-11-05 DIAGNOSIS — R262 Difficulty in walking, not elsewhere classified: Secondary | ICD-10-CM | POA: Diagnosis not present

## 2022-11-05 DIAGNOSIS — M6281 Muscle weakness (generalized): Secondary | ICD-10-CM | POA: Diagnosis not present

## 2022-11-05 DIAGNOSIS — R296 Repeated falls: Secondary | ICD-10-CM

## 2022-11-05 NOTE — Therapy (Signed)
OUTPATIENT PHYSICAL THERAPY TREATMENT   Patient Name: Anna Castillo MRN: [id number] DOB:[birth year], 67 y.o., female Today's Date: 11/05/2022  END OF SESSION:  PT End of Session - 11/05/22 1018     Visit Number 5    Date for PT Re-Evaluation 12/31/22    Authorization Type [employer] MCR    Progress Note Due on Visit 10    PT Start Time 1016    PT Stop Time 1059    PT Time Calculation (min) 43 min    Activity Tolerance Patient tolerated treatment well    Behavior During Therapy [facility] for tasks assessed/performed               Past Medical History:  Diagnosis Date   Anemia May 2023   Anxiety    Arthritis    Blood transfusion without reported diagnosis    Cancer (HCC)    Chronic back pain    Dizziness    GERD (gastroesophageal reflux disease)    Heart palpitations    Hypercholesterolemia    Hypertension    Lightheadedness    Multiple myeloma (HCC)    Neuromuscular disorder (HCC) Jan 2024   Peripheral Neuropathy   Obesity    PONV (postoperative nausea and vomiting)    Post-menopausal    at age 46   SOB (shortness of breath)    climbing stairs   Tachycardia    Past Surgical History:  Procedure Laterality Date   ABDOMINAL HYSTERECTOMY     BACK SURGERY     BREATH TEK H PYLORI N/A 05/02/2014   Procedure: BREATH TEK Richardo Priest;  Surgeon: Ovidio Kin, MD;  Location: [facility];  Service: General;  Laterality: N/A;   CESAREAN SECTION     CESAREAN SECTION     CHOLECYSTECTOMY     colonscopy      LAPAROSCOPIC GASTRIC SLEEVE RESECTION N/A 06/27/2014   Procedure: LAPAROSCOPIC SLEEVE GASTRETOMY WITH UPPER ENDOSCOPY;  Surgeon: Ovidio Kin, MD;  Location: [facility];  Service: General;  Laterality: N/A;   PARTIAL HYSTERECTOMY     SPINE SURGERY     TONSILLECTOMY     TONSILLECTOMY     Patient Active Problem List   Diagnosis Date Noted   Multiple myeloma (HCC) 10/20/2022   Anxiety 10/20/2022   Irritable bowel syndrome with constipation 10/20/2022   Gastroesophageal  reflux disease 10/20/2022   CINV (chemotherapy-induced nausea and vomiting) 10/20/2022   Immunocompromised (HCC) 10/20/2022   Chronic low back pain 10/20/2022   Polyneuropathy associated with underlying disease (HCC) 10/20/2022    PCP: Garnette Gunner, MD   REFERRING PROVIDER: Warrick Parisian, MD  REFERRING DIAG: Z94.84 H/o autologous stem cell transplant, G62.9 Peripheral neuropathy  THERAPY DIAG:  Muscle weakness (generalized)  Unsteadiness on feet  Repeated falls  Difficulty in walking, not elsewhere classified  Rationale for Evaluation and Treatment: Rehabilitation  ONSET DATE: December 2023  SUBJECTIVE:   SUBJECTIVE STATEMENT: Doing well, just a little tired today.   PERTINENT HISTORY: Multiple myeloma, stem cell transplant December 2023,  polyneuropathy, chronic LBP, immunocompromised. GERD, IBS, osteopenia PAIN:  Are you having pain? No  PRECAUTIONS: Fall  WEIGHT BEARING RESTRICTIONS: No  FALLS:  Has patient fallen in last 6 months? Yes. Number of falls 5-6  LIVING ENVIRONMENT: Lives with: lives with their family Lives in: House/apartment Stairs: Yes: Internal: 14 steps; on left going up and to bonus room over garage, does not need to go up Has following equipment at home: Single point cane, Walker - 2 wheeled, Environmental consultant -  4 wheeled, Tour manager, and Grab bars  OCCUPATION: retired   PLOF: Independent  PATIENT GOALS: would like to improve stamina and balance, walk around the neighborhood again, not fall  NEXT MD VISIT: next week and August for shots.   OBJECTIVE:   PATIENT SURVEYS:  LEFS 51/80= 63.75%  COGNITION: Overall cognitive status: Within functional limits for tasks assessed     SENSATION: Severe neuropathy both feet are numb, some neuropathy in right hand 4th and 5th fingers   POSTURE: rounded shoulders and forward head   LOWER EXTREMITY MMT:  MMT Right eval Left eval  Hip flexion 4+ 4+  Hip extension 5 (seated) 5 (seated)  Hip  abduction 5 5  Hip adduction 5 5  Knee flexion 4+ 4+  Knee extension 4+ 4+  Ankle dorsiflexion 2+ 4  Ankle plantarflexion 5 (WB) 5 (WB)   (Blank rows = not tested)   FUNCTIONAL TESTS:  5 times sit to stand: 18.8 seconds with hands on thighs Timed up and go (TUG): 13.5 sec without AD, but unsteady; 16.5 sec with quad cane, more steady MCTSIB: Condition 1: Avg of 3 trials: 30 sec, Condition 2: Avg of 3 trials: 30 sec, Condition 3: Avg of 3 trials: 30 sec, Condition 4: Avg of 3 trials: 3 sec, and Total Score: 93/120 Tandem stance - unable, semitandem - 30 seconds, each side   GAIT: Distance walked: 70' Assistive device utilized: None Level of assistance: SBA Comments: very unsteady, decreased gait speed, decreased heel strike on R, visually slow gait speed.    TODAY'S TREATMENT:                                                                                                                              DATE:   11/05/2022 Therapeutic Exercise: to improve strength and mobility.  Demo, verbal and tactile cues throughout for technique.  Bike L1 x 6 min  Walking on toes 2 x 8' - 1 UE support Walking on heels - unable to raise R toe yet 2 x 8' - 1 UE support on wall Neuromuscular Reeducation: to improve balance and stability. SBA for safety throughout.  In corner for safety On airex: Eyes open feet apart x 30 sec Eyes open with w/s forward/back x 30 sec   Eyes open feet apart with head nods x 10 Eyes open feet apart with head turns x 10 On level surface: Tandem stance x 30 sec each side Eyes closed feet apart x 30 sec Eyes closed feet apart with head nods x 10 Eyes closed feet apart with head turns x 10  Foot on stool -with head nods x 10 each side Foot on stool -with head turns x 10 each side Wall bumps x 20 Tandem walking 3 x 8' Retro tandem walking 3 x 8'   11/03/22 Therapeutic Exercise: to improve strength and mobility.  Demo, verbal and tactile cues throughout for  technique. Seated LAQ no weight x 20  for warm up Gait 3 laps (270) ft no AD for warm up Seated R ankle DF x 20 Runner stretch 2x30 sec bil Standing heel raise 2x15 Hip hike from 2' book 2x10 bil  Neuro Re-ed: Tandem stance x 20 sec switching foot position Unilateral march x 5 bil intermittent UE support Step up and over small bean bag 2x5 bil- more sway and difficulty standing on RLE   10/29/22 Therapeutic Exercise: to improve strength and mobility.  Demo, verbal and tactile cues throughout for technique. Nustep L5 x 6 min  Seated toe raises x 10  At counter -heel raises x 15 -hip abduction 2 x 10 -hip extension 2 x 10 -hip flexion 2 x 10 -tandem balance x 15 sec each  In corner for safety - tandem balance  Therapeutic Activity:  to assess balance Sharlene Motts 43/56    PATIENT EDUCATION:  Education details: continue HEP  Person educated: Patient Education method: Explanation, Demonstration, and Handouts Education comprehension: verbalized understanding  HOME EXERCISE PROGRAM: Access Code: [email or web address] URL: [email or web address] Date: 11/05/2022 Prepared by: Harrie Foreman  Exercises - Gastroc Stretch on Wall  - 2 x daily - 7 x weekly - 2 sets - 30 sec hold - Sit to Stand  - 1 x daily - 7 x weekly - 2 sets - 10 reps - Heel Raises with Counter Support  - 1 x daily - 7 x weekly - 2 sets - 10 reps - Standing Hip Abduction with Resistance at Ankles and Counter Support  - 1 x daily - 7 x weekly - 2 sets - 10 reps - Standing Hip Extension with Counter Support  - 1 x daily - 7 x weekly - 2 sets - 10 reps - Standing Hip Flexion with Counter Support  - 1 x daily - 7 x weekly - 2 sets - 10 reps - Tandem Stance in Corner  - 1 x daily - 7 x weekly - 1 sets - 3 reps - 15-30 sec hold - Seated Toe Raise  - 1 x daily - 7 x weekly - 2 sets - 10 reps - 5 sec  hold  ASSESSMENT:  CLINICAL IMPRESSION: Anna Castillo reports no new concerns, performing her exercises at home.    Today focused on balance, on corner for safety, challenged today, needing intermittent seated rest breaks for muscle recovery, demonstrating hip and core weakness.  She still has signifcant weakness in  R ankle DF, she has not yet purchased foot up DF assist, discussed again for safety, she will look at it again today as [employer] day  Anna Castillo continues to demonstrate potential for improvement and would benefit from continued skilled therapy to address impairments.      OBJECTIVE IMPAIRMENTS: Abnormal gait, decreased activity tolerance, decreased balance, decreased endurance, decreased mobility, difficulty walking, decreased strength, decreased safety awareness, increased edema, impaired perceived functional ability, and impaired sensation.   ACTIVITY LIMITATIONS: carrying, lifting, bending, standing, squatting, stairs, and locomotion level  PARTICIPATION LIMITATIONS: meal prep, cleaning, laundry, driving, shopping, and community activity  PERSONAL FACTORS: Past/current experiences and 3+ comorbidities: Multiple myeloma, stem cell transplant December 2023,  polyneuropathy, chronic LBP, immunocompromised. GERD, IBS, osteopenia  are also affecting patient's functional outcome.   REHAB POTENTIAL: Good  CLINICAL DECISION MAKING: Evolving/moderate complexity  EVALUATION COMPLEXITY: Moderate   GOALS: Goals reviewed with patient? Yes  SHORT TERM GOALS: Target date: 11/12/2022   Patient will be independent with initial HEP. Baseline: needs Goal status: MET- 11/03/22   LONG  TERM GOALS: Target date: 12/31/2022   Patient will be independent with advanced/ongoing HEP to improve outcomes and carryover.  Baseline:  Goal status: IN PROGRESS  2.  Patient will be able to ambulate 600' with LRAD with good safety to access community.  Baseline: fatigues quickly, R foot drop Goal status: IN PROGRESS  3.  Patient will be able to step up/down curb safely with LRAD for safety with community  ambulation.  Baseline: not safe Goal status: IN PROGRESS   4.  Patient will demonstrate improved functional LE strength as demonstrated by 5x STS < 15 seconds without UE assist. Baseline: 18.8 sec with hands on thighs Goal status: IN PROGRESS  5.  Patient will demonstrate at least 19/24 on DGI to improve gait stability and reduce risk for falls. Baseline: 19/24 with AD Goal status: MET  6.  Patient will score >49/56 on Berg Balance test to demonstrate safety without AD Baseline: 43/56 Goal status: IN PROGRESS  7.  Patient will report 8 points improvement on LEFS to demonstrate improved functional ability. Baseline: 51/80 Goal status: IN PROGRESS  8.  Patient will demonstrate gait speed of >0.8 m/s to be a safe community ambulator with decreased risk for recurrent falls.  Baseline: visually slow gait speed Goal status: IN PROGRESS    PLAN:  PT FREQUENCY: 1-2x/week  PT DURATION: 10 weeks  PLANNED INTERVENTIONS: Therapeutic exercises, Therapeutic activity, Neuromuscular re-education, Balance training, Gait training, Patient/Family education, Self Care, Joint mobilization, Electrical stimulation, Taping, Manual therapy, and Re-evaluation  PLAN FOR NEXT SESSION: Continue LE strengthening and balance activities.    Jena Gauss, PT 11/05/2022, 11:10 AM

## 2022-11-10 ENCOUNTER — Ambulatory Visit: Payer: Medicare HMO

## 2022-11-10 DIAGNOSIS — M6281 Muscle weakness (generalized): Secondary | ICD-10-CM | POA: Diagnosis not present

## 2022-11-10 DIAGNOSIS — R296 Repeated falls: Secondary | ICD-10-CM | POA: Diagnosis not present

## 2022-11-10 DIAGNOSIS — R262 Difficulty in walking, not elsewhere classified: Secondary | ICD-10-CM | POA: Diagnosis not present

## 2022-11-10 DIAGNOSIS — R2681 Unsteadiness on feet: Secondary | ICD-10-CM | POA: Diagnosis not present

## 2022-11-10 NOTE — Therapy (Signed)
OUTPATIENT PHYSICAL THERAPY TREATMENT   Patient Name: Yunuen Mordan MRN: [id number] DOB:Feb 08, 1956, 67 y.o., female Today's Date: 11/10/2022  END OF SESSION:  PT End of Session - 11/10/22 1143     Visit Number 6    Date for PT Re-Evaluation 12/31/22    Authorization Type [employer] MCR    Progress Note Due on Visit 10    PT Start Time 1103    PT Stop Time 1143    PT Time Calculation (min) 40 min    Activity Tolerance Patient tolerated treatment well    Behavior During Therapy [facility] for tasks assessed/performed                Past Medical History:  Diagnosis Date   Anemia May 2023   Anxiety    Arthritis    Blood transfusion without reported diagnosis    Cancer (HCC)    Chronic back pain    Dizziness    GERD (gastroesophageal reflux disease)    Heart palpitations    Hypercholesterolemia    Hypertension    Lightheadedness    Multiple myeloma (HCC)    Neuromuscular disorder (HCC) Jan 2024   Peripheral Neuropathy   Obesity    PONV (postoperative nausea and vomiting)    Post-menopausal    at age 17   SOB (shortness of breath)    climbing stairs   Tachycardia    Past Surgical History:  Procedure Laterality Date   ABDOMINAL HYSTERECTOMY     BACK SURGERY     BREATH TEK H PYLORI N/A 05/02/2014   Procedure: BREATH TEK Richardo Priest;  Surgeon: Ovidio Kin, MD;  Location: [facility];  Service: General;  Laterality: N/A;   CESAREAN SECTION     CESAREAN SECTION     CHOLECYSTECTOMY     colonscopy      LAPAROSCOPIC GASTRIC SLEEVE RESECTION N/A 06/27/2014   Procedure: LAPAROSCOPIC SLEEVE GASTRETOMY WITH UPPER ENDOSCOPY;  Surgeon: Ovidio Kin, MD;  Location: [facility];  Service: General;  Laterality: N/A;   PARTIAL HYSTERECTOMY     SPINE SURGERY     TONSILLECTOMY     TONSILLECTOMY     Patient Active Problem List   Diagnosis Date Noted   Multiple myeloma (HCC) 10/20/2022   Anxiety 10/20/2022   Irritable bowel syndrome with constipation 10/20/2022   Gastroesophageal  reflux disease 10/20/2022   CINV (chemotherapy-induced nausea and vomiting) 10/20/2022   Immunocompromised (HCC) 10/20/2022   Chronic low back pain 10/20/2022   Polyneuropathy associated with underlying disease (HCC) 10/20/2022    PCP: Garnette Gunner, MD   REFERRING PROVIDER: Warrick Parisian, MD  REFERRING DIAG: Z94.84 H/o autologous stem cell transplant, G62.9 Peripheral neuropathy  THERAPY DIAG:  Muscle weakness (generalized)  Unsteadiness on feet  Repeated falls  Difficulty in walking, not elsewhere classified  Rationale for Evaluation and Treatment: Rehabilitation  ONSET DATE: December 2023  SUBJECTIVE:   SUBJECTIVE STATEMENT: A little sore in the hips today, spent the night with a friend, could've been the mattress she slept on.  PERTINENT HISTORY: Multiple myeloma, stem cell transplant December 2023,  polyneuropathy, chronic LBP, immunocompromised. GERD, IBS, osteopenia PAIN:  Are you having pain? No  PRECAUTIONS: Fall  WEIGHT BEARING RESTRICTIONS: No  FALLS:  Has patient fallen in last 6 months? Yes. Number of falls 5-6  LIVING ENVIRONMENT: Lives with: lives with their family Lives in: House/apartment Stairs: Yes: Internal: 14 steps; on left going up and to bonus room over garage, does not need to go up  Has following equipment at home: Single point cane, Walker - 2 wheeled, Environmental consultant - 4 wheeled, Tour manager, and Grab bars  OCCUPATION: retired   PLOF: Independent  PATIENT GOALS: would like to improve stamina and balance, walk around the neighborhood again, not fall  NEXT MD VISIT: next week and August for shots.   OBJECTIVE:   PATIENT SURVEYS:  LEFS 51/80= 63.75%  COGNITION: Overall cognitive status: Within functional limits for tasks assessed     SENSATION: Severe neuropathy both feet are numb, some neuropathy in right hand 4th and 5th fingers   POSTURE: rounded shoulders and forward head   LOWER EXTREMITY MMT:  MMT Right eval Left eval   Hip flexion 4+ 4+  Hip extension 5 (seated) 5 (seated)  Hip abduction 5 5  Hip adduction 5 5  Knee flexion 4+ 4+  Knee extension 4+ 4+  Ankle dorsiflexion 2+ 4  Ankle plantarflexion 5 (WB) 5 (WB)   (Blank rows = not tested)   FUNCTIONAL TESTS:  5 times sit to stand: 18.8 seconds with hands on thighs Timed up and go (TUG): 13.5 sec without AD, but unsteady; 16.5 sec with quad cane, more steady MCTSIB: Condition 1: Avg of 3 trials: 30 sec, Condition 2: Avg of 3 trials: 30 sec, Condition 3: Avg of 3 trials: 30 sec, Condition 4: Avg of 3 trials: 3 sec, and Total Score: 93/120 Tandem stance - unable, semitandem - 30 seconds, each side   GAIT: Distance walked: 51' Assistive device utilized: None Level of assistance: SBA Comments: very unsteady, decreased gait speed, decreased heel strike on R, visually slow gait speed.    TODAY'S TREATMENT:                                                                                                                              DATE:  11/10/2022 Therapeutic Exercise: to improve strength and mobility.  Demo, verbal and tactile cues throughout for technique.  Bike L1 x 6 min  Standing march 2# x 10 bil Standing hip abduction 2# x 10 bil Standing hip extension 2# x 10 bil Seated LAQ 2# 2 x 10 bil  Neuromuscular Reeducation: to improve balance and stability. SBA for safety throughout.  Unilateral march x 10 bil intermittent UE support Tandem gait 3x12 ft On airex: Fwd step on and off of pad x 5 bil  Lateral step on and off of pad x 10 bil Anterior/Posterior sway x 10  11/05/2022 Therapeutic Exercise: to improve strength and mobility.  Demo, verbal and tactile cues throughout for technique.  Bike L1 x 6 min  Walking on toes 2 x 8' - 1 UE support Walking on heels - unable to raise R toe yet 2 x 8' - 1 UE support on wall Neuromuscular Reeducation: to improve balance and stability. SBA for safety throughout.  In corner for safety On airex: Eyes  open feet apart x 30 sec Eyes open with w/s forward/back x 30  sec   Eyes open feet apart with head nods x 10 Eyes open feet apart with head turns x 10 On level surface: Tandem stance x 30 sec each side Eyes closed feet apart x 30 sec Eyes closed feet apart with head nods x 10 Eyes closed feet apart with head turns x 10  Foot on stool -with head nods x 10 each side Foot on stool -with head turns x 10 each side Wall bumps x 20 Tandem walking 3 x 8' Retro tandem walking 3 x 8'   11/03/22 Therapeutic Exercise: to improve strength and mobility.  Demo, verbal and tactile cues throughout for technique. Seated LAQ no weight x 20 for warm up Gait 3 laps (270) ft no AD for warm up Seated R ankle DF x 20 Runner stretch 2x30 sec bil Standing heel raise 2x15 Hip hike from 2' book 2x10 bil  Neuro Re-ed: Tandem stance x 20 sec switching foot position Unilateral march x 5 bil intermittent UE support Step up and over small bean bag 2x5 bil- more sway and difficulty standing on RLE   10/29/22 Therapeutic Exercise: to improve strength and mobility.  Demo, verbal and tactile cues throughout for technique. Nustep L5 x 6 min  Seated toe raises x 10  At counter -heel raises x 15 -hip abduction 2 x 10 -hip extension 2 x 10 -hip flexion 2 x 10 -tandem balance x 15 sec each  In corner for safety - tandem balance  Therapeutic Activity:  to assess balance Sharlene Motts 43/56    PATIENT EDUCATION:  Education details: continue HEP  Person educated: Patient Education method: Explanation, Demonstration, and Handouts Education comprehension: verbalized understanding  HOME EXERCISE PROGRAM: Access Code: [email or web address] URL: [email or web address] Date: 11/05/2022 Prepared by: Harrie Foreman  Exercises - Gastroc Stretch on Wall  - 2 x daily - 7 x weekly - 2 sets - 30 sec hold - Sit to Stand  - 1 x daily - 7 x weekly - 2 sets - 10 reps - Heel Raises with Counter Support  - 1 x daily - 7 x  weekly - 2 sets - 10 reps - Standing Hip Abduction with Resistance at Ankles and Counter Support  - 1 x daily - 7 x weekly - 2 sets - 10 reps - Standing Hip Extension with Counter Support  - 1 x daily - 7 x weekly - 2 sets - 10 reps - Standing Hip Flexion with Counter Support  - 1 x daily - 7 x weekly - 2 sets - 10 reps - Tandem Stance in Corner  - 1 x daily - 7 x weekly - 1 sets - 3 reps - 15-30 sec hold - Seated Toe Raise  - 1 x daily - 7 x weekly - 2 sets - 10 reps - 5 sec  hold  ASSESSMENT:  CLINICAL IMPRESSION: Pt completed all interventions without any limitations today. Most difficulty when having to stand on one LE during balance activities. Mostly SBA given throughout session but CGA needed at times. Also progressed strengthening for hips to improve balance and stability. Good response to interventions, will continue to work on balance and strength to tolerance.  Emry Tobin Hollopeter continues to demonstrate potential for improvement and would benefit from continued skilled therapy to address impairments.      OBJECTIVE IMPAIRMENTS: Abnormal gait, decreased activity tolerance, decreased balance, decreased endurance, decreased mobility, difficulty walking, decreased strength, decreased safety awareness, increased edema, impaired perceived functional ability, and impaired sensation.   ACTIVITY LIMITATIONS:  carrying, lifting, bending, standing, squatting, stairs, and locomotion level  PARTICIPATION LIMITATIONS: meal prep, cleaning, laundry, driving, shopping, and community activity  PERSONAL FACTORS: Past/current experiences and 3+ comorbidities: Multiple myeloma, stem cell transplant December 2023,  polyneuropathy, chronic LBP, immunocompromised. GERD, IBS, osteopenia  are also affecting patient's functional outcome.   REHAB POTENTIAL: Good  CLINICAL DECISION MAKING: Evolving/moderate complexity  EVALUATION COMPLEXITY: Moderate   GOALS: Goals reviewed with patient? Yes  SHORT TERM  GOALS: Target date: 11/12/2022   Patient will be independent with initial HEP. Baseline: needs Goal status: MET- 11/03/22   LONG TERM GOALS: Target date: 12/31/2022   Patient will be independent with advanced/ongoing HEP to improve outcomes and carryover.  Baseline:  Goal status: IN PROGRESS  2.  Patient will be able to ambulate 600' with LRAD with good safety to access community.  Baseline: fatigues quickly, R foot drop Goal status: IN PROGRESS  3.  Patient will be able to step up/down curb safely with LRAD for safety with community ambulation.  Baseline: not safe Goal status: IN PROGRESS   4.  Patient will demonstrate improved functional LE strength as demonstrated by 5x STS < 15 seconds without UE assist. Baseline: 18.8 sec with hands on thighs Goal status: IN PROGRESS  5.  Patient will demonstrate at least 19/24 on DGI to improve gait stability and reduce risk for falls. Baseline: 19/24 with AD Goal status: MET  6.  Patient will score >49/56 on Berg Balance test to demonstrate safety without AD Baseline: 43/56 Goal status: IN PROGRESS  7.  Patient will report 8 points improvement on LEFS to demonstrate improved functional ability. Baseline: 51/80 Goal status: IN PROGRESS  8.  Patient will demonstrate gait speed of >0.8 m/s to be a safe community ambulator with decreased risk for recurrent falls.  Baseline: visually slow gait speed Goal status: IN PROGRESS    PLAN:  PT FREQUENCY: 1-2x/week  PT DURATION: 10 weeks  PLANNED INTERVENTIONS: Therapeutic exercises, Therapeutic activity, Neuromuscular re-education, Balance training, Gait training, Patient/Family education, Self Care, Joint mobilization, Electrical stimulation, Taping, Manual therapy, and Re-evaluation  PLAN FOR NEXT SESSION: Continue LE strengthening and balance activities.    Darleene Cleaver, PTA 11/10/2022, 11:44 AM

## 2022-11-12 ENCOUNTER — Encounter: Payer: Self-pay | Admitting: Physical Therapy

## 2022-11-12 ENCOUNTER — Ambulatory Visit: Payer: Medicare HMO | Admitting: Physical Therapy

## 2022-11-12 DIAGNOSIS — M6281 Muscle weakness (generalized): Secondary | ICD-10-CM

## 2022-11-12 DIAGNOSIS — R262 Difficulty in walking, not elsewhere classified: Secondary | ICD-10-CM

## 2022-11-12 DIAGNOSIS — R2681 Unsteadiness on feet: Secondary | ICD-10-CM

## 2022-11-12 DIAGNOSIS — R296 Repeated falls: Secondary | ICD-10-CM | POA: Diagnosis not present

## 2022-11-12 NOTE — Therapy (Signed)
OUTPATIENT PHYSICAL THERAPY TREATMENT   Patient Name: Anna Castillo MRN: [id number] DOB:[birth year], 67 y.o., female Today's Date: 11/12/2022  END OF SESSION:  PT End of Session - 11/12/22 1015     Visit Number 7    Date for PT Re-Evaluation 12/31/22    Authorization Type [employer] MCR    Progress Note Due on Visit 10    PT Start Time 1015    PT Stop Time 1100    PT Time Calculation (min) 45 min    Activity Tolerance Patient tolerated treatment well    Behavior During Therapy [facility] for tasks assessed/performed                Past Medical History:  Diagnosis Date   Anemia May 2023   Anxiety    Arthritis    Blood transfusion without reported diagnosis    Cancer (HCC)    Chronic back pain    Dizziness    GERD (gastroesophageal reflux disease)    Heart palpitations    Hypercholesterolemia    Hypertension    Lightheadedness    Multiple myeloma (HCC)    Neuromuscular disorder (HCC) Jan 2024   Peripheral Neuropathy   Obesity    PONV (postoperative nausea and vomiting)    Post-menopausal    at age 42   SOB (shortness of breath)    climbing stairs   Tachycardia    Past Surgical History:  Procedure Laterality Date   ABDOMINAL HYSTERECTOMY     BACK SURGERY     BREATH TEK H PYLORI N/A 05/02/2014   Procedure: BREATH TEK Richardo Priest;  Surgeon: Ovidio Kin, MD;  Location: [facility];  Service: General;  Laterality: N/A;   CESAREAN SECTION     CESAREAN SECTION     CHOLECYSTECTOMY     colonscopy      LAPAROSCOPIC GASTRIC SLEEVE RESECTION N/A 06/27/2014   Procedure: LAPAROSCOPIC SLEEVE GASTRETOMY WITH UPPER ENDOSCOPY;  Surgeon: Ovidio Kin, MD;  Location: [facility];  Service: General;  Laterality: N/A;   PARTIAL HYSTERECTOMY     SPINE SURGERY     TONSILLECTOMY     TONSILLECTOMY     Patient Active Problem List   Diagnosis Date Noted   Multiple myeloma (HCC) 10/20/2022   Anxiety 10/20/2022   Irritable bowel syndrome with constipation 10/20/2022   Gastroesophageal  reflux disease 10/20/2022   CINV (chemotherapy-induced nausea and vomiting) 10/20/2022   Immunocompromised (HCC) 10/20/2022   Chronic low back pain 10/20/2022   Polyneuropathy associated with underlying disease (HCC) 10/20/2022    PCP: Garnette Gunner, MD   REFERRING PROVIDER: Warrick Parisian, MD  REFERRING DIAG: Z94.84 H/o autologous stem cell transplant, G62.9 Peripheral neuropathy  THERAPY DIAG:  Muscle weakness (generalized)  Unsteadiness on feet  Repeated falls  Difficulty in walking, not elsewhere classified  Rationale for Evaluation and Treatment: Rehabilitation  ONSET DATE: December 2023  SUBJECTIVE:   SUBJECTIVE STATEMENT: Little tired, didn't sleep well.    PERTINENT HISTORY: Multiple myeloma, stem cell transplant December 2023,  polyneuropathy, chronic LBP, immunocompromised. GERD, IBS, osteopenia PAIN:  Are you having pain? No soreness across low back and R hip  PRECAUTIONS: Fall  WEIGHT BEARING RESTRICTIONS: No  FALLS:  Has patient fallen in last 6 months? Yes. Number of falls 5-6  LIVING ENVIRONMENT: Lives with: lives with their family Lives in: House/apartment Stairs: Yes: Internal: 14 steps; on left going up and to bonus room over garage, does not need to go up Has following equipment at home: Single  point cane, Walker - 2 wheeled, Environmental consultant - 4 wheeled, Tour manager, and Grab bars  OCCUPATION: retired   PLOF: Independent  PATIENT GOALS: would like to improve stamina and balance, walk around the neighborhood again, not fall  NEXT MD VISIT: next week and August for shots.   OBJECTIVE:   PATIENT SURVEYS:  LEFS 51/80= 63.75%  COGNITION: Overall cognitive status: Within functional limits for tasks assessed     SENSATION: Severe neuropathy both feet are numb, some neuropathy in right hand 4th and 5th fingers   POSTURE: rounded shoulders and forward head   LOWER EXTREMITY MMT:  MMT Right eval Left eval  Hip flexion 4+ 4+  Hip  extension 5 (seated) 5 (seated)  Hip abduction 5 5  Hip adduction 5 5  Knee flexion 4+ 4+  Knee extension 4+ 4+  Ankle dorsiflexion 2+ 4  Ankle plantarflexion 5 (WB) 5 (WB)   (Blank rows = not tested)   FUNCTIONAL TESTS:  5 times sit to stand: 18.8 seconds with hands on thighs Timed up and go (TUG): 13.5 sec without AD, but unsteady; 16.5 sec with quad cane, more steady MCTSIB: Condition 1: Avg of 3 trials: 30 sec, Condition 2: Avg of 3 trials: 30 sec, Condition 3: Avg of 3 trials: 30 sec, Condition 4: Avg of 3 trials: 3 sec, and Total Score: 93/120 Tandem stance - unable, semitandem - 30 seconds, each side   GAIT: Distance walked: 43' Assistive device utilized: None Level of assistance: SBA Comments: very unsteady, decreased gait speed, decreased heel strike on R, visually slow gait speed.    TODAY'S TREATMENT:                                                                                                                              DATE:   11/12/22 Therapeutic Exercise: to improve strength and mobility.  Demo, verbal and tactile cues throughout for technique. Nustep L5 x 6 min  Squats 2 x 10  Seated toe raises 2 x 10  Standing march 2 x 10  - no UE support Gait x 600' Manual Therapy: to decrease muscle spasm and pain and improve mobility IASTM to bil glutes, lumbar paraspinals and QL with foam roller then percussive massager, grade 1 PA mobs lumbar spine and sacrum.   11/10/2022 Therapeutic Exercise: to improve strength and mobility.  Demo, verbal and tactile cues throughout for technique.  Bike L1 x 6 min  Standing march 2# x 10 bil Standing hip abduction 2# x 10 bil Standing hip extension 2# x 10 bil Seated LAQ 2# 2 x 10 bil  Neuromuscular Reeducation: to improve balance and stability. SBA for safety throughout.  Unilateral march x 10 bil intermittent UE support Tandem gait 3x12 ft On airex: Fwd step on and off of pad x 5 bil  Lateral step on and off of pad x 10  bil Anterior/Posterior sway x 10  11/05/2022 Therapeutic Exercise: to improve strength and  mobility.  Demo, verbal and tactile cues throughout for technique.  Bike L1 x 6 min  Walking on toes 2 x 8' - 1 UE support Walking on heels - unable to raise R toe yet 2 x 8' - 1 UE support on wall Neuromuscular Reeducation: to improve balance and stability. SBA for safety throughout.  In corner for safety On airex: Eyes open feet apart x 30 sec Eyes open with w/s forward/back x 30 sec   Eyes open feet apart with head nods x 10 Eyes open feet apart with head turns x 10 On level surface: Tandem stance x 30 sec each side Eyes closed feet apart x 30 sec Eyes closed feet apart with head nods x 10 Eyes closed feet apart with head turns x 10  Foot on stool -with head nods x 10 each side Foot on stool -with head turns x 10 each side Wall bumps x 20 Tandem walking 3 x 8' Retro tandem walking 3 x 8'   PATIENT EDUCATION:  Education details: continue HEP  Person educated: Patient Education method: Explanation, Demonstration, and Handouts Education comprehension: verbalized understanding  HOME EXERCISE PROGRAM: Access Code: [email or web address] URL: [email or web address] Date: 11/05/2022 Prepared by: Harrie Foreman  Exercises - Gastroc Stretch on Wall  - 2 x daily - 7 x weekly - 2 sets - 30 sec hold - Sit to Stand  - 1 x daily - 7 x weekly - 2 sets - 10 reps - Heel Raises with Counter Support  - 1 x daily - 7 x weekly - 2 sets - 10 reps - Standing Hip Abduction with Resistance at Ankles and Counter Support  - 1 x daily - 7 x weekly - 2 sets - 10 reps - Standing Hip Extension with Counter Support  - 1 x daily - 7 x weekly - 2 sets - 10 reps - Standing Hip Flexion with Counter Support  - 1 x daily - 7 x weekly - 2 sets - 10 reps - Tandem Stance in Corner  - 1 x daily - 7 x weekly - 1 sets - 3 reps - 15-30 sec hold - Seated Toe Raise  - 1 x daily - 7 x weekly - 2 sets - 10 reps - 5 sec   hold  ASSESSMENT:  CLINICAL IMPRESSION: Salimatou Simone Salminen reporting more soreness today in low back and R hip.  She is demonstrating improving R ankle DF strength, and today was able to ambulate 600' without AD and without catching her R toe, although still lands with foot flat, and noted gait becoming antalgic towards end, finished session with manual therapy to address muscle soreness/pain.  Reported decreased pain following interventions.  Ever Halberg Reiber continues to demonstrate potential for improvement and would benefit from continued skilled therapy to address impairments.      OBJECTIVE IMPAIRMENTS: Abnormal gait, decreased activity tolerance, decreased balance, decreased endurance, decreased mobility, difficulty walking, decreased strength, decreased safety awareness, increased edema, impaired perceived functional ability, and impaired sensation.   ACTIVITY LIMITATIONS: carrying, lifting, bending, standing, squatting, stairs, and locomotion level  PARTICIPATION LIMITATIONS: meal prep, cleaning, laundry, driving, shopping, and community activity  PERSONAL FACTORS: Past/current experiences and 3+ comorbidities: Multiple myeloma, stem cell transplant December 2023,  polyneuropathy, chronic LBP, immunocompromised. GERD, IBS, osteopenia  are also affecting patient's functional outcome.   REHAB POTENTIAL: Good  CLINICAL DECISION MAKING: Evolving/moderate complexity  EVALUATION COMPLEXITY: Moderate   GOALS: Goals reviewed with patient? Yes  SHORT TERM GOALS: Target date: 11/12/2022  Patient will be independent with initial HEP. Baseline: needs Goal status: MET- 11/03/22   LONG TERM GOALS: Target date: 12/31/2022   Patient will be independent with advanced/ongoing HEP to improve outcomes and carryover.  Baseline:  Goal status: IN PROGRESS  2.  Patient will be able to ambulate 600' with LRAD with good safety to access community.  Baseline: fatigues quickly, R foot drop Goal  status: IN PROGRESS  11/12/22- 600' with no foot drop or drag, fatigued at end noted gait more antalgic.  3.  Patient will be able to step up/down curb safely with LRAD for safety with community ambulation.  Baseline: not safe Goal status: IN PROGRESS   4.  Patient will demonstrate improved functional LE strength as demonstrated by 5x STS < 15 seconds without UE assist. Baseline: 18.8 sec with hands on thighs Goal status: IN PROGRESS  5.  Patient will demonstrate at least 19/24 on DGI to improve gait stability and reduce risk for falls. Baseline: 19/24 with AD Goal status: MET  6.  Patient will score >49/56 on Berg Balance test to demonstrate safety without AD Baseline: 43/56 Goal status: IN PROGRESS  7.  Patient will report 8 points improvement on LEFS to demonstrate improved functional ability. Baseline: 51/80 Goal status: IN PROGRESS  8.  Patient will demonstrate gait speed of >0.8 m/s to be a safe community ambulator with decreased risk for recurrent falls.  Baseline: visually slow gait speed Goal status: IN PROGRESS    PLAN:  PT FREQUENCY: 1-2x/week  PT DURATION: 10 weeks  PLANNED INTERVENTIONS: Therapeutic exercises, Therapeutic activity, Neuromuscular re-education, Balance training, Gait training, Patient/Family education, Self Care, Joint mobilization, Electrical stimulation, Taping, Manual therapy, and Re-evaluation  PLAN FOR NEXT SESSION: Continue LE strengthening and balance activities.    Jena Gauss, PT 11/12/2022, 11:09 AM

## 2022-11-13 ENCOUNTER — Ambulatory Visit
Admission: RE | Admit: 2022-11-13 | Discharge: 2022-11-13 | Disposition: A | Discharge status: Home / Self Care | Payer: Medicare HMO | Source: Ambulatory Visit | Attending: Internal Medicine | Admitting: Internal Medicine

## 2022-11-13 DIAGNOSIS — Z1231 Encounter for screening mammogram for malignant neoplasm of breast: Secondary | ICD-10-CM

## 2022-11-17 ENCOUNTER — Ambulatory Visit: Payer: Medicare HMO

## 2022-11-19 ENCOUNTER — Ambulatory Visit: Payer: Medicare HMO

## 2022-11-20 DIAGNOSIS — L905 Scar conditions and fibrosis of skin: Secondary | ICD-10-CM | POA: Diagnosis not present

## 2022-11-20 DIAGNOSIS — D045 Carcinoma in situ of skin of trunk: Secondary | ICD-10-CM | POA: Diagnosis not present

## 2022-11-24 ENCOUNTER — Ambulatory Visit (INDEPENDENT_AMBULATORY_CARE_PROVIDER_SITE_OTHER): Payer: Medicare HMO

## 2022-11-24 DIAGNOSIS — D708 Other neutropenia: Secondary | ICD-10-CM | POA: Diagnosis not present

## 2022-11-24 DIAGNOSIS — Z0000 Encounter for general adult medical examination without abnormal findings: Secondary | ICD-10-CM

## 2022-11-24 DIAGNOSIS — D696 Thrombocytopenia, unspecified: Secondary | ICD-10-CM | POA: Diagnosis not present

## 2022-11-24 DIAGNOSIS — Z9484 Stem cells transplant status: Secondary | ICD-10-CM | POA: Diagnosis not present

## 2022-11-24 DIAGNOSIS — C9001 Multiple myeloma in remission: Secondary | ICD-10-CM | POA: Diagnosis not present

## 2022-11-24 DIAGNOSIS — G620 Drug-induced polyneuropathy: Secondary | ICD-10-CM | POA: Diagnosis not present

## 2022-11-24 DIAGNOSIS — D638 Anemia in other chronic diseases classified elsewhere: Secondary | ICD-10-CM | POA: Diagnosis not present

## 2022-11-24 NOTE — Patient Instructions (Signed)
[name] , Thank you for taking time to come for your Medicare Wellness Visit. I appreciate your ongoing commitment to your health goals. Please review the following plan we discussed and let me know if I can assist you in the future.   Referrals/Orders/Follow-Ups/Clinician Recommendations: none  This is a list of the screening recommended for you and due dates:  Health Maintenance  Topic Date Due   Hepatitis C Screening  Never done   COVID-19 Vaccine (5 - 2023-24 season) 12/20/2021   Flu Shot  11/20/2022   Medicare Annual Wellness Visit  11/24/2023   Mammogram  11/12/2024   Colon Cancer Screening  11/28/2031   DTaP/Tdap/Td vaccine (2 - Tdap) 10/07/2032   Pneumonia Vaccine  Completed   DEXA scan (bone density measurement)  Completed   Zoster (Shingles) Vaccine  Completed   HPV Vaccine  Aged Out    Advanced directives: (Copy Requested) Please bring a copy of your health care power of attorney and living will to the office to be added to your chart at your convenience.  Next Medicare Annual Wellness Visit scheduled for next year: Yes  Preventive Care 68 Years and Older, Female Preventive care refers to lifestyle choices and visits with your health care provider that can promote health and wellness. What does preventive care include? A yearly physical exam. This is also called an annual well check. Dental exams once or twice a year. Routine eye exams. Ask your health care provider how often you should have your eyes checked. Personal lifestyle choices, including: Daily care of your teeth and gums. Regular physical activity. Eating a healthy diet. Avoiding tobacco and drug use. Limiting alcohol use. Practicing safe sex. Taking low-dose aspirin every day. Taking vitamin and mineral supplements as recommended by your health care provider. What happens during an annual well check? The services and screenings done by your health care provider during your annual well check will  depend on your age, overall health, lifestyle risk factors, and family history of disease. Counseling  Your health care provider may ask you questions about your: Alcohol use. Tobacco use. Drug use. Emotional well-being. Home and relationship well-being. Sexual activity. Eating habits. History of falls. Memory and ability to understand (cognition). Work and work Astronomer. Reproductive health. Screening  You may have the following tests or measurements: Height, weight, and BMI. Blood pressure. Lipid and cholesterol levels. These may be checked every 5 years, or more frequently if you are over 71 years old. Skin check. Lung cancer screening. You may have this screening every year starting at age 59 if you have a 30-pack-year history of smoking and currently smoke or have quit within the past 15 years. Fecal occult blood test (FOBT) of the stool. You may have this test every year starting at age 35. Flexible sigmoidoscopy or colonoscopy. You may have a sigmoidoscopy every 5 years or a colonoscopy every 10 years starting at age 17. Hepatitis C blood test. Hepatitis B blood test. Sexually transmitted disease (STD) testing. Diabetes screening. This is done by checking your blood sugar (glucose) after you have not eaten for a while (fasting). You may have this done every 1-3 years. Bone density scan. This is done to screen for osteoporosis. You may have this done starting at age 75. Mammogram. This may be done every 1-2 years. Talk to your health care provider about how often you should have regular mammograms. Talk with your health care provider about your test results, treatment options, and if necessary, the need for  more tests. Vaccines  Your health care provider may recommend certain vaccines, such as: Influenza vaccine. This is recommended every year. Tetanus, diphtheria, and acellular pertussis (Tdap, Td) vaccine. You may need a Td booster every 10 years. Zoster vaccine. You may  need this after age 68. Pneumococcal 13-valent conjugate (PCV13) vaccine. One dose is recommended after age 86. Pneumococcal polysaccharide (PPSV23) vaccine. One dose is recommended after age 45. Talk to your health care provider about which screenings and vaccines you need and how often you need them. This information is not intended to replace advice given to you by your health care provider. Make sure you discuss any questions you have with your health care provider. Document Released: 05/04/2015 Document Revised: 12/26/2015 Document Reviewed: 02/06/2015 Elsevier Interactive Patient Education  2017 [employer].  Fall Prevention in the Home Falls can cause injuries. They can happen to people of all ages. There are many things you can do to make your home safe and to help prevent falls. What can I do on the outside of my home? Regularly fix the edges of walkways and driveways and fix any cracks. Remove anything that might make you trip as you walk through a door, such as a raised step or threshold. Trim any bushes or trees on the path to your home. Use bright outdoor lighting. Clear any walking paths of anything that might make someone trip, such as rocks or tools. Regularly check to see if handrails are loose or broken. Make sure that both sides of any steps have handrails. Any raised decks and porches should have guardrails on the edges. Have any leaves, snow, or ice cleared regularly. Use sand or salt on walking paths during winter. Clean up any spills in your garage right away. This includes oil or grease spills. What can I do in the bathroom? Use night lights. Install grab bars by the toilet and in the tub and shower. Do not use towel bars as grab bars. Use non-skid mats or decals in the tub or shower. If you need to sit down in the shower, use a plastic, non-slip stool. Keep the floor dry. Clean up any water that spills on the floor as soon as it happens. Remove soap buildup in  the tub or shower regularly. Attach bath mats securely with double-sided non-slip rug tape. Do not have throw rugs and other things on the floor that can make you trip. What can I do in the bedroom? Use night lights. Make sure that you have a light by your bed that is easy to reach. Do not use any sheets or blankets that are too big for your bed. They should not hang down onto the floor. Have a firm chair that has side arms. You can use this for support while you get dressed. Do not have throw rugs and other things on the floor that can make you trip. What can I do in the kitchen? Clean up any spills right away. Avoid walking on wet floors. Keep items that you use a lot in easy-to-reach places. If you need to reach something above you, use a strong step stool that has a grab bar. Keep electrical cords out of the way. Do not use floor polish or wax that makes floors slippery. If you must use wax, use non-skid floor wax. Do not have throw rugs and other things on the floor that can make you trip. What can I do with my stairs? Do not leave any items on the stairs. Make  sure that there are handrails on both sides of the stairs and use them. Fix handrails that are broken or loose. Make sure that handrails are as long as the stairways. Check any carpeting to make sure that it is firmly attached to the stairs. Fix any carpet that is loose or worn. Avoid having throw rugs at the top or bottom of the stairs. If you do have throw rugs, attach them to the floor with carpet tape. Make sure that you have a light switch at the top of the stairs and the bottom of the stairs. If you do not have them, ask someone to add them for you. What else can I do to help prevent falls? Wear shoes that: Do not have high heels. Have rubber bottoms. Are comfortable and fit you well. Are closed at the toe. Do not wear sandals. If you use a stepladder: Make sure that it is fully opened. Do not climb a closed  stepladder. Make sure that both sides of the stepladder are locked into place. Ask someone to hold it for you, if possible. Clearly mark and make sure that you can see: Any grab bars or handrails. First and last steps. Where the edge of each step is. Use tools that help you move around (mobility aids) if they are needed. These include: Canes. Walkers. Scooters. Crutches. Turn on the lights when you go into a dark area. Replace any light bulbs as soon as they burn out. Set up your furniture so you have a clear path. Avoid moving your furniture around. If any of your floors are uneven, fix them. If there are any pets around you, be aware of where they are. Review your medicines with your doctor. Some medicines can make you feel dizzy. This can increase your chance of falling. Ask your doctor what other things that you can do to help prevent falls. This information is not intended to replace advice given to you by your health care provider. Make sure you discuss any questions you have with your health care provider. Document Released: 02/01/2009 Document Revised: 09/13/2015 Document Reviewed: 05/12/2014 Elsevier Interactive Patient Education  2017 [employer].

## 2022-11-24 NOTE — Progress Notes (Signed)
Subjective:   Anna Castillo is a 67 y.o. female who presents for an Initial Medicare Annual Wellness Visit.  Visit Complete: Virtual  I connected with  [name] on 11/24/22 by a audio enabled telemedicine application and verified that I am speaking with the correct person using two identifiers.  Patient Location: Home  Provider Location: Office/Clinic  I discussed the limitations of evaluation and management by telemedicine. The patient expressed understanding and agreed to proceed.  Vital Signs: Patient was unable to self-report vital signs via telehealth due to a lack of equipment at home.  Review of Systems     Cardiac Risk Factors include: advanced age (>53men, >49 women)     Objective:    Today's Vitals   There is no height or weight on file to calculate BMI.     11/24/2022    4:01 PM 10/22/2022    3:49 PM 02/24/2022    2:10 PM 06/27/2014   10:33 AM 06/27/2014    5:24 AM 06/20/2014    9:05 AM 04/25/2014    3:58 PM  Advanced Directives  Does Patient Have a Medical Advance Directive? Yes Yes No No  No No  Type of Estate agent of [place];Living will Healthcare Power of [place];Living will       Does patient want to make changes to medical advance directive?  No - Patient declined       Copy of Healthcare Power of Attorney in Chart? No - copy requested No - copy requested       Would patient like information on creating a medical advance directive?    No - patient declined information -- Yes - Educational materials given No - patient declined information    Current Medications (verified) Outpatient Encounter Medications as of 11/24/2022  Medication Sig   LORazepam (ATIVAN) 0.5 MG tablet TAKE 1 TABLET BY MOUTH 3 TIMES A DAY AS NEEDED FOR ANXIETY OR NAUSEA FOR UP TO 10 DAYS, MAX DAILY AMOUNT 1.5MG    omeprazole (PRILOSEC OTC) 20 MG tablet Take 20 mg by mouth daily.   ondansetron (ZOFRAN) 4 MG tablet Take 1 tablet (4 mg total) by mouth every 6  (six) hours.   propranolol (INDERAL) 10 MG tablet Take 1 tablet by mouth 3 (three) times daily.   sertraline (ZOLOFT) 50 MG tablet Take 1.5 tablets (75 mg total) by mouth daily.   valACYclovir (VALTREX) 500 MG tablet Take by mouth.   vitamin E (VITAMIN E) 400 UNIT capsule Take 400 Units by mouth daily.   ALPRAZolam (XANAX) 0.25 MG tablet Take 0.25 mg by mouth at bedtime as needed for sleep. (Patient not taking: Reported on 11/24/2022)   pregabalin (LYRICA) 75 MG capsule Take by mouth.   No facility-administered encounter medications on file as of 11/24/2022.    Allergies (verified) Hydrocodone, Other, Penicillins, and Penicillamine   History: Past Medical History:  Diagnosis Date   Anemia May 2023   Anxiety    Arthritis    Blood transfusion without reported diagnosis    Cancer (HCC)    Chronic back pain    Dizziness    GERD (gastroesophageal reflux disease)    Heart palpitations    Hypercholesterolemia    Hypertension    Lightheadedness    Multiple myeloma (HCC)    Neuromuscular disorder (HCC) Jan 2024   Peripheral Neuropathy   Obesity    PONV (postoperative nausea and vomiting)    Post-menopausal    at age 15   SOB (shortness  of breath)    climbing stairs   Tachycardia    Past Surgical History:  Procedure Laterality Date   ABDOMINAL HYSTERECTOMY     BACK SURGERY     BREATH TEK H PYLORI N/A 05/02/2014   Procedure: BREATH TEK H PYLORI;  Surgeon: Ovidio Kin, MD;  Location: [facility];  Service: General;  Laterality: N/A;   CESAREAN SECTION     CESAREAN SECTION     CHOLECYSTECTOMY     colonscopy      LAPAROSCOPIC GASTRIC SLEEVE RESECTION N/A 06/27/2014   Procedure: LAPAROSCOPIC SLEEVE GASTRETOMY WITH UPPER ENDOSCOPY;  Surgeon: Ovidio Kin, MD;  Location: [facility];  Service: General;  Laterality: N/A;   PARTIAL HYSTERECTOMY     SPINE SURGERY     TONSILLECTOMY     TONSILLECTOMY     Family History  Problem Relation Age of Onset   Hypertension Father    Diabetes  Father    Breast cancer Mother    Ovarian cancer Sister    Breast cancer Sister    Social History   Socioeconomic History   Marital status: Divorced    Spouse name: Not on file   Number of children: Not on file   Years of education: Not on file   Highest education level: Not on file  Occupational History   Not on file  Tobacco Use   Smoking status: Former    Current packs/day: 0.00    Average packs/day: 0.5 packs/day for 30.0 years (15.0 ttl pk-yrs)    Types: Cigarettes    Start date: 03/14/1984    Quit date: 03/14/2014    Years since quitting: 8.7    Passive exposure: Never   Smokeless tobacco: Never  Vaping Use   Vaping status: Never Used  Substance and Sexual Activity   Alcohol use: Yes    Comment: weekends drinks 3 glasses of wine each time    Drug use: No   Sexual activity: Not Currently  Other Topics Concern   Not on file  Social History Narrative   Not on file   Social Determinants of Health   Financial Resource Strain: Low Risk  (11/24/2022)   Overall Financial Resource Strain (CARDIA)    Difficulty of Paying Living Expenses: Not hard at all  Food Insecurity: No Food Insecurity (11/24/2022)   Hunger Vital Sign    Worried About Running Out of Food in the Last Year: Never true    Ran Out of Food in the Last Year: Never true  Transportation Needs: No Transportation Needs (11/24/2022)   PRAPARE - Administrator, Civil Service (Medical): No    Lack of Transportation (Non-Medical): No  Physical Activity: Insufficiently Active (11/24/2022)   Exercise Vital Sign    Days of Exercise per Week: 2 days    Minutes of Exercise per Session: 50 min  Stress: No Stress Concern Present (11/24/2022)   [employer] of Occupational Health - Occupational Stress Questionnaire    Feeling of Stress : Not at all  Social Connections: Moderately Isolated (11/24/2022)   Social Connection and Isolation Panel [NHANES]    Frequency of Communication with Friends and Family: Three  times a week    Frequency of Social Gatherings with Friends and Family: More than three times a week    Attends Religious Services: More than 4 times per year    Active Member of [employer] or Organizations: No    Attends Banker Meetings: Never    Marital Status: Divorced  Tobacco Counseling Counseling given: Not Answered   Clinical Intake:  Pre-visit preparation completed: Yes  Pain : No/denies pain     Nutritional Risks: None Diabetes: No  How often do you need to have someone help you when you read instructions, pamphlets, or other written materials from your doctor or pharmacy?: 1 - Never  Interpreter Needed?: No  Information entered by :: NAllen LPN   Activities of Daily Living    11/24/2022    3:56 PM  In your present state of health, do you have any difficulty performing the following activities:  Hearing? 0  Vision? 0  Difficulty concentrating or making decisions? 0  Walking or climbing stairs? 0  Dressing or bathing? 0  Doing errands, shopping? 0  Preparing Food and eating ? N  Using the Toilet? N  In the past six months, have you accidently leaked urine? N  Do you have problems with loss of bowel control? N  Managing your Medications? N  Managing your Finances? N  Housekeeping or managing your Housekeeping? N    Patient Care Team: Garnette Gunner, MD as PCP - General (Family Medicine)  Indicate any recent Medical Services you may have received from other than [facility] providers in the past year (date may be approximate).     Assessment:   This is a routine wellness examination for Naomie.  Hearing/Vision screen Hearing Screening - Comments:: Denies hearing issues Vision Screening - Comments:: Regular eye exams, [facility]  Dietary issues and exercise activities discussed:     Goals Addressed             This Visit's Progress    Patient Stated       11/24/2022, working on walking       Depression Screen     11/24/2022    4:04 PM 10/20/2022    1:48 PM  PHQ 2/9 Scores  PHQ - 2 Score 0 0  PHQ- 9 Score 6 0    Fall Risk    11/24/2022    4:02 PM 10/20/2022    1:48 PM  Fall Risk   Falls in the past year? 1 1  Comment fell in shower, lost balance   Number falls in past yr: 1 1  Injury with Fall? 0 1  Comment bruising   Risk for fall due to : Medication side effect;History of fall(s);Impaired balance/gait   Follow up Falls prevention discussed;Falls evaluation completed     MEDICARE RISK AT HOME:  Medicare Risk at Home - 11/24/22 1603     Any stairs in or around the home? No    If so, are there any without handrails? No    Home free of loose throw rugs in walkways, pet beds, electrical cords, etc? Yes    Adequate lighting in your home to reduce risk of falls? Yes    Life alert? No    Use of a cane, walker or w/c? No    Grab bars in the bathroom? Yes    Shower chair or bench in shower? Yes    Elevated toilet seat or a handicapped toilet? Yes             TIMED UP AND GO:  Was the test performed? No    Cognitive Function:        11/24/2022    4:05 PM  6CIT Screen  What Year? 0 points  What month? 0 points  What time? 0 points  Count back from 20 0 points  Months in reverse 0 points  Repeat phrase 2 points  Total Score 2 points    Immunizations Immunization History  Administered Date(s) Administered   Moderna Sars-Covid-2 Vaccination 06/18/2019, 07/16/2019, 03/12/2020   PFIZER(Purple Top)SARS-COV-2 Vaccination 08/04/2020   PNEUMOCOCCAL CONJUGATE-20 10/08/2022   Vaxelis (DTaP,IPV,Hib,HepB) 10/08/2022    TDAP status: Up to date  Flu Vaccine status: Due, Education has been provided regarding the importance of this vaccine. Advised may receive this vaccine at local pharmacy or Health Dept. Aware to provide a copy of the vaccination record if obtained from local pharmacy or Health Dept. Verbalized acceptance and understanding.  Pneumococcal vaccine status: Up to  date  Covid-19 vaccine status: Information provided on how to obtain vaccines.   Qualifies for Shingles Vaccine? Yes   Zostavax completed No   Shingrix Completed?: Yes  Screening Tests Health Maintenance  Topic Date Due   Hepatitis C Screening  Never done   COVID-19 Vaccine (5 - 2023-24 season) 12/20/2021   INFLUENZA VACCINE  11/20/2022   Medicare Annual Wellness (AWV)  11/24/2023   MAMMOGRAM  11/12/2024   Colonoscopy  11/28/2031   DTaP/Tdap/Td (2 - Tdap) 10/07/2032   Pneumonia Vaccine 46+ Years old  Completed   DEXA SCAN  Completed   Zoster Vaccines- Shingrix  Completed   HPV VACCINES  Aged Out    Health Maintenance  Health Maintenance Due  Topic Date Due   Hepatitis C Screening  Never done   COVID-19 Vaccine (5 - 2023-24 season) 12/20/2021   INFLUENZA VACCINE  11/20/2022    Colorectal cancer screening: Type of screening: Colonoscopy. Completed 11/27/2021. Repeat every 10 years  Mammogram status: Completed 11/13/2022. Repeat every year  Bone Density status: Completed 07/10/2020  Lung Cancer Screening: (Low Dose CT Chest recommended if Age 73-80 years, 20 pack-year currently smoking OR have quit w/in 15years.) does not qualify.   Lung Cancer Screening Referral: no  Additional Screening:  Hepatitis C Screening: does qualify;  Vision Screening: Recommended annual ophthalmology exams for early detection of glaucoma and other disorders of the eye. Is the patient up to date with their annual eye exam?  Yes  Who is the provider or what is the name of the office in which the patient attends annual eye exams? [facility] If pt is not established with a provider, would they like to be referred to a provider to establish care? No .   Dental Screening: Recommended annual dental exams for proper oral hygiene  Diabetic Foot Exam: n/a  Community Resource Referral / Chronic Care Management: CRR required this visit?  No   CCM required this visit?  No     Plan:     I have  personally reviewed and noted the following in the patient's chart:   Medical and social history Use of alcohol, tobacco or illicit drugs  Current medications and supplements including opioid prescriptions. Patient is not currently taking opioid prescriptions. Functional ability and status Nutritional status Physical activity Advanced directives List of other physicians Hospitalizations, surgeries, and ER visits in previous 12 months Vitals Screenings to include cognitive, depression, and falls Referrals and appointments  In addition, I have reviewed and discussed with patient certain preventive protocols, quality metrics, and best practice recommendations. A written personalized care plan for preventive services as well as general preventive health recommendations were provided to patient.     Barb Merino, LPN   09/27/6293   After Visit Summary: (MyChart) Due to this being a telephonic visit, the after visit summary with patients  personalized plan was offered to patient via MyChart   Nurse Notes: none

## 2022-11-26 ENCOUNTER — Ambulatory Visit: Payer: Medicare HMO | Attending: Hematology and Oncology

## 2022-11-26 DIAGNOSIS — R262 Difficulty in walking, not elsewhere classified: Secondary | ICD-10-CM | POA: Diagnosis not present

## 2022-11-26 DIAGNOSIS — M6281 Muscle weakness (generalized): Secondary | ICD-10-CM | POA: Diagnosis not present

## 2022-11-26 DIAGNOSIS — R2681 Unsteadiness on feet: Secondary | ICD-10-CM | POA: Insufficient documentation

## 2022-11-26 DIAGNOSIS — R296 Repeated falls: Secondary | ICD-10-CM | POA: Insufficient documentation

## 2022-11-26 NOTE — Therapy (Addendum)
OUTPATIENT PHYSICAL THERAPY TREATMENT/Discharge   Patient Name: Anna Castillo MRN: [id number] DOB:June 28, 1955, 67 y.o., female Today's Date: 11/26/2022  END OF SESSION:  PT End of Session - 11/26/22 1103     Visit Number 8    Date for PT Re-Evaluation 12/31/22    Authorization Type [employer] MCR    Progress Note Due on Visit 10    PT Start Time 1058    PT Stop Time 1143    PT Time Calculation (min) 45 min    Activity Tolerance Patient tolerated treatment well    Behavior During Therapy [facility] for tasks assessed/performed                 Past Medical History:  Diagnosis Date   Anemia May 2023   Anxiety    Arthritis    Blood transfusion without reported diagnosis    Cancer (HCC)    Chronic back pain    Dizziness    GERD (gastroesophageal reflux disease)    Heart palpitations    Hypercholesterolemia    Hypertension    Lightheadedness    Multiple myeloma (HCC)    Neuromuscular disorder (HCC) Jan 2024   Peripheral Neuropathy   Obesity    PONV (postoperative nausea and vomiting)    Post-menopausal    at age 7   SOB (shortness of breath)    climbing stairs   Tachycardia    Past Surgical History:  Procedure Laterality Date   ABDOMINAL HYSTERECTOMY     BACK SURGERY     BREATH TEK H PYLORI N/A 05/02/2014   Procedure: BREATH TEK Richardo Priest;  Surgeon: Ovidio Kin, MD;  Location: [facility];  Service: General;  Laterality: N/A;   CESAREAN SECTION     CESAREAN SECTION     CHOLECYSTECTOMY     colonscopy      LAPAROSCOPIC GASTRIC SLEEVE RESECTION N/A 06/27/2014   Procedure: LAPAROSCOPIC SLEEVE GASTRETOMY WITH UPPER ENDOSCOPY;  Surgeon: Ovidio Kin, MD;  Location: [facility];  Service: General;  Laterality: N/A;   PARTIAL HYSTERECTOMY     SPINE SURGERY     TONSILLECTOMY     TONSILLECTOMY     Patient Active Problem List   Diagnosis Date Noted   Multiple myeloma (HCC) 10/20/2022   Anxiety 10/20/2022   Irritable bowel syndrome with constipation 10/20/2022    Gastroesophageal reflux disease 10/20/2022   CINV (chemotherapy-induced nausea and vomiting) 10/20/2022   Immunocompromised (HCC) 10/20/2022   Chronic low back pain 10/20/2022   Polyneuropathy associated with underlying disease (HCC) 10/20/2022    PCP: Garnette Gunner, MD   REFERRING PROVIDER: Warrick Parisian, MD  REFERRING DIAG: Z94.84 H/o autologous stem cell transplant, G62.9 Peripheral neuropathy  THERAPY DIAG:  Muscle weakness (generalized)  Unsteadiness on feet  Repeated falls  Difficulty in walking, not elsewhere classified  Rationale for Evaluation and Treatment: Rehabilitation  ONSET DATE: December 2023  SUBJECTIVE:   SUBJECTIVE STATEMENT: Pt denies any pain or stiffness today.    PERTINENT HISTORY: Multiple myeloma, stem cell transplant December 2023,  polyneuropathy, chronic LBP, immunocompromised. GERD, IBS, osteopenia PAIN:  Are you having pain? No soreness across low back and R hip  PRECAUTIONS: Fall  WEIGHT BEARING RESTRICTIONS: No  FALLS:  Has patient fallen in last 6 months? Yes. Number of falls 5-6  LIVING ENVIRONMENT: Lives with: lives with their family Lives in: House/apartment Stairs: Yes: Internal: 14 steps; on left going up and to bonus room over garage, does not need to go up Has following equipment  at home: Single point cane, Walker - 2 wheeled, Environmental consultant - 4 wheeled, Tour manager, and Grab bars  OCCUPATION: retired   PLOF: Independent  PATIENT GOALS: would like to improve stamina and balance, walk around the neighborhood again, not fall  NEXT MD VISIT: next week and August for shots.   OBJECTIVE:   PATIENT SURVEYS:  LEFS 51/80= 63.75%  COGNITION: Overall cognitive status: Within functional limits for tasks assessed     SENSATION: Severe neuropathy both feet are numb, some neuropathy in right hand 4th and 5th fingers   POSTURE: rounded shoulders and forward head   LOWER EXTREMITY MMT:  MMT Right eval Left eval  Hip  flexion 4+ 4+  Hip extension 5 (seated) 5 (seated)  Hip abduction 5 5  Hip adduction 5 5  Knee flexion 4+ 4+  Knee extension 4+ 4+  Ankle dorsiflexion 2+ 4  Ankle plantarflexion 5 (WB) 5 (WB)   (Blank rows = not tested)   FUNCTIONAL TESTS:  5 times sit to stand: 18.8 seconds with hands on thighs Timed up and go (TUG): 13.5 sec without AD, but unsteady; 16.5 sec with quad cane, more steady MCTSIB: Condition 1: Avg of 3 trials: 30 sec, Condition 2: Avg of 3 trials: 30 sec, Condition 3: Avg of 3 trials: 30 sec, Condition 4: Avg of 3 trials: 3 sec, and Total Score: 93/120 Tandem stance - unable, semitandem - 30 seconds, each side   GAIT: Distance walked: 29' Assistive device utilized: None Level of assistance: SBA Comments: very unsteady, decreased gait speed, decreased heel strike on R, visually slow gait speed.    TODAY'S TREATMENT:                                                                                                                              DATE:  11/26/22 Therapeutic Exercise: to improve strength and mobility.  Demo, verbal and tactile cues throughout for technique. Nustep L5 x 6 min  5xSTS- 14.06 with UE support; 16.39 w/o UE support Squats at counter 2x10 with RTB Standing hip abd + ext with RTB x 10  Side- steps RTB 2x61ft down and back  Monster walks RTB 2x92ft down and back Stepping on foam ovals and airex pads walking on unsteady surface: Fwd and back 4x Side steps 3x each way Supine trunk rotation feet on ball x 10  HS curl feet on ball x 10  Ktos and figure 4 stretch in supine  11/12/22 Therapeutic Exercise: to improve strength and mobility.  Demo, verbal and tactile cues throughout for technique. Nustep L5 x 6 min  Squats 2 x 10  Seated toe raises 2 x 10  Standing march 2 x 10  - no UE support Gait x 600' Manual Therapy: to decrease muscle spasm and pain and improve mobility IASTM to bil glutes, lumbar paraspinals and QL with foam roller then  percussive massager, grade 1 PA mobs lumbar spine and sacrum.   11/10/2022  Therapeutic Exercise: to improve strength and mobility.  Demo, verbal and tactile cues throughout for technique.  Bike L1 x 6 min  Standing march 2# x 10 bil Standing hip abduction 2# x 10 bil Standing hip extension 2# x 10 bil Seated LAQ 2# 2 x 10 bil  Neuromuscular Reeducation: to improve balance and stability. SBA for safety throughout.  Unilateral march x 10 bil intermittent UE support Tandem gait 3x12 ft On airex: Fwd step on and off of pad x 5 bil  Lateral step on and off of pad x 10 bil Anterior/Posterior sway x 10  11/05/2022 Therapeutic Exercise: to improve strength and mobility.  Demo, verbal and tactile cues throughout for technique.  Bike L1 x 6 min  Walking on toes 2 x 8' - 1 UE support Walking on heels - unable to raise R toe yet 2 x 8' - 1 UE support on wall Neuromuscular Reeducation: to improve balance and stability. SBA for safety throughout.  In corner for safety On airex: Eyes open feet apart x 30 sec Eyes open with w/s forward/back x 30 sec   Eyes open feet apart with head nods x 10 Eyes open feet apart with head turns x 10 On level surface: Tandem stance x 30 sec each side Eyes closed feet apart x 30 sec Eyes closed feet apart with head nods x 10 Eyes closed feet apart with head turns x 10  Foot on stool -with head nods x 10 each side Foot on stool -with head turns x 10 each side Wall bumps x 20 Tandem walking 3 x 8' Retro tandem walking 3 x 8'   PATIENT EDUCATION:  Education details: HEP - monster walk and side step Person educated: Patient Education method: Explanation, Demonstration, and Handouts Education comprehension: verbalized understanding  HOME EXERCISE PROGRAM: Access Code: [email or web address] URL: [email or web address] Date: 11/26/2022 Prepared by: Verta Ellen  Exercises - Gastroc Stretch on Wall  - 2 x daily - 7 x weekly - 2 sets - 30 sec hold - Sit to  Stand  - 1 x daily - 7 x weekly - 2 sets - 10 reps - Heel Raises with Counter Support  - 1 x daily - 7 x weekly - 2 sets - 10 reps - Standing Hip Abduction with Resistance at Ankles and Counter Support  - 1 x daily - 7 x weekly - 2 sets - 10 reps - Standing Hip Extension with Counter Support  - 1 x daily - 7 x weekly - 2 sets - 10 reps - Standing Hip Flexion with Counter Support  - 1 x daily - 7 x weekly - 2 sets - 10 reps - Tandem Stance in Corner  - 1 x daily - 7 x weekly - 1 sets - 3 reps - 15-30 sec hold - Seated Toe Raise  - 1 x daily - 7 x weekly - 2 sets - 10 reps - 5 sec  hold - Side Stepping with Resistance at Thighs  - 1 x daily - 7 x weekly - 2 sets - 10 reps - Forward Monster Walk with Resistance at [employer] and Counter Support  - 1 x daily - 7 x weekly - 2 sets - 10 reps  ASSESSMENT:  CLINICAL IMPRESSION: Continued progressing hip strength and balance activities. Added side step and monster walk to HEP. She had some LOB with the walking on unsteady surfaces. Needs few seated breaks throughout session. Progressing with 5xSTS goal.  Manuela Schwartz Manter continues to demonstrate potential  for improvement and would benefit from continued skilled therapy to address impairments.      OBJECTIVE IMPAIRMENTS: Abnormal gait, decreased activity tolerance, decreased balance, decreased endurance, decreased mobility, difficulty walking, decreased strength, decreased safety awareness, increased edema, impaired perceived functional ability, and impaired sensation.   ACTIVITY LIMITATIONS: carrying, lifting, bending, standing, squatting, stairs, and locomotion level  PARTICIPATION LIMITATIONS: meal prep, cleaning, laundry, driving, shopping, and community activity  PERSONAL FACTORS: Past/current experiences and 3+ comorbidities: Multiple myeloma, stem cell transplant December 2023,  polyneuropathy, chronic LBP, immunocompromised. GERD, IBS, osteopenia  are also affecting patient's functional outcome.    REHAB POTENTIAL: Good  CLINICAL DECISION MAKING: Evolving/moderate complexity  EVALUATION COMPLEXITY: Moderate   GOALS: Goals reviewed with patient? Yes  SHORT TERM GOALS: Target date: 11/12/2022   Patient will be independent with initial HEP. Baseline: needs Goal status: MET- 11/03/22   LONG TERM GOALS: Target date: 12/31/2022   Patient will be independent with advanced/ongoing HEP to improve outcomes and carryover.  Baseline:  Goal status: IN PROGRESS  2.  Patient will be able to ambulate 600' with LRAD with good safety to access community.  Baseline: fatigues quickly, R foot drop Goal status: IN PROGRESS  11/12/22- 600' with no foot drop or drag, fatigued at end noted gait more antalgic.  3.  Patient will be able to step up/down curb safely with LRAD for safety with community ambulation.  Baseline: not safe Goal status: IN PROGRESS  4.  Patient will demonstrate improved functional LE strength as demonstrated by 5x STS < 15 seconds without UE assist. Baseline: 18.8 sec with hands on thighs Goal status: IN PROGRESS- 16.39 with UE support 11/26/22  5.  Patient will demonstrate at least 19/24 on DGI to improve gait stability and reduce risk for falls. Baseline: 19/24 with AD Goal status: MET  6.  Patient will score >49/56 on Berg Balance test to demonstrate safety without AD Baseline: 43/56 Goal status: IN PROGRESS  7.  Patient will report 8 points improvement on LEFS to demonstrate improved functional ability. Baseline: 51/80 Goal status: IN PROGRESS  8.  Patient will demonstrate gait speed of >0.8 m/s to be a safe community ambulator with decreased risk for recurrent falls.  Baseline: visually slow gait speed Goal status: IN PROGRESS    PLAN:  PT FREQUENCY: 1-2x/week  PT DURATION: 10 weeks  PLANNED INTERVENTIONS: Therapeutic exercises, Therapeutic activity, Neuromuscular re-education, Balance training, Gait training, Patient/Family education, Self Care, Joint  mobilization, Electrical stimulation, Taping, Manual therapy, and Re-evaluation  PLAN FOR NEXT SESSION: Continue LE strengthening and balance activities.    Darleene Cleaver, PTA 11/26/2022, 11:56 AM   PHYSICAL THERAPY DISCHARGE SUMMARY  Visits from Start of Care: 8  Current functional level related to goals / functional outcomes: Improved functional strength, decreased foot drop   Remaining deficits: Decreased balance especially over compliat surfaces   Education / Equipment: HEP   Plan: Patient agrees to discharge.  Patient goals were not met. Patient requested discharge after contracting shingles.       Jena Gauss, PT, DPT 12/08/2022 8:25 AM

## 2022-12-02 ENCOUNTER — Encounter: Payer: Self-pay | Admitting: Physical Therapy

## 2022-12-04 ENCOUNTER — Emergency Department (HOSPITAL_BASED_OUTPATIENT_CLINIC_OR_DEPARTMENT_OTHER)
Admission: EM | Admit: 2022-12-04 | Discharge: 2022-12-04 | Disposition: A | Discharge status: Home / Self Care | Payer: Medicare HMO | Attending: Emergency Medicine | Admitting: Emergency Medicine

## 2022-12-04 ENCOUNTER — Ambulatory Visit: Payer: Medicare HMO | Admitting: Internal Medicine

## 2022-12-04 ENCOUNTER — Other Ambulatory Visit: Payer: Self-pay

## 2022-12-04 ENCOUNTER — Encounter (HOSPITAL_BASED_OUTPATIENT_CLINIC_OR_DEPARTMENT_OTHER): Payer: Self-pay | Admitting: Emergency Medicine

## 2022-12-04 DIAGNOSIS — B029 Zoster without complications: Secondary | ICD-10-CM | POA: Diagnosis not present

## 2022-12-04 DIAGNOSIS — M25512 Pain in left shoulder: Secondary | ICD-10-CM | POA: Diagnosis not present

## 2022-12-04 DIAGNOSIS — M542 Cervicalgia: Secondary | ICD-10-CM | POA: Diagnosis not present

## 2022-12-04 DIAGNOSIS — M546 Pain in thoracic spine: Secondary | ICD-10-CM | POA: Diagnosis not present

## 2022-12-04 MED ORDER — OXYCODONE-ACETAMINOPHEN 5-325 MG PO TABS: 1.0000 | ORAL_TABLET | Freq: Four times a day (QID) | ORAL | 0 refills | Status: DC | PRN

## 2022-12-04 MED ORDER — OXYCODONE-ACETAMINOPHEN 5-325 MG PO TABS
2.0000 | ORAL_TABLET | Freq: Once | ORAL | Status: AC
  Administered 2022-12-04: 2 via ORAL
  Filled 2022-12-04: qty 2

## 2022-12-04 MED ORDER — VALACYCLOVIR HCL 1 G PO TABS: 1000.0000 mg | ORAL_TABLET | Freq: Three times a day (TID) | ORAL | 0 refills | Status: AC

## 2022-12-04 NOTE — Discharge Instructions (Signed)
I sent in the prescription which is an increase of your Valtrex, instead of taking 500 mg daily which is your preventative dose he will be taking 1 g 3 times daily for the next week.  You can take the narcotic pain medication up to every 6 hours, I recommend that you take daily to twice daily MiraLAX to help prevent constipation while you are taking this medication.  Please follow-up with your PCP and discuss whether or not they feel strongly about you being on steroid therapy or not or if they recommend any dose adjustments to your Cymbalta

## 2022-12-04 NOTE — ED Triage Notes (Signed)
Pt having left left neck, shoulder and back pain x 2 days.  Rash across chest since yesterday.  Pt did receive some vaccines one month ago.  No known fever.  No insect bites.

## 2022-12-04 NOTE — ED Provider Notes (Signed)
[facility] Provider Note   CSN: [id number] Arrival date & time: 12/04/22  1152     History  Chief Complaint  Patient presents with   Shoulder Pain   Neck Pain   Back Pain   Rash    Anna Castillo is a 67 y.o. female with past medical history significant for multiple myeloma who is immunocompromise at baseline, previous stem cell transplant, who presents with concern for left neck, shoulder and back pain for 2 days.  She noticed a rash across the chest since yesterday.  She did start receiving some vaccines around 1 month ago.   Shoulder Pain Associated symptoms: back pain and neck pain   Neck Pain Back Pain Rash      Home Medications Prior to Admission medications   Medication Sig Start Date End Date Taking? Authorizing Provider  ALPRAZolam Prudy Feeler) 0.25 MG tablet Take 0.25 mg by mouth at bedtime as needed for sleep. Patient not taking: Reported on 11/24/2022    [provider]  LORazepam (ATIVAN) 0.5 MG tablet TAKE 1 TABLET BY MOUTH 3 TIMES A DAY AS NEEDED FOR ANXIETY OR NAUSEA FOR UP TO 10 DAYS, MAX DAILY AMOUNT 1.5MG  09/11/22   [provider]  omeprazole (PRILOSEC OTC) 20 MG tablet Take 20 mg by mouth daily.    [provider]  ondansetron (ZOFRAN) 4 MG tablet Take 1 tablet (4 mg total) by mouth every 6 (six) hours. 03/25/18   Wynetta Fines, MD  oxyCODONE-acetaminophen (PERCOCET/ROXICET) 5-325 MG tablet Take 1 tablet by mouth every 6 (six) hours as needed for severe pain. 12/04/22  Yes Wilbert Schouten H, PA-C  pregabalin (LYRICA) 75 MG capsule Take by mouth. 10/08/22 11/07/22  [provider]  propranolol (INDERAL) 10 MG tablet Take 1 tablet by mouth 3 (three) times daily. 09/17/22   [provider]  sertraline (ZOLOFT) 50 MG tablet Take 1.5 tablets (75 mg total) by mouth daily. 10/20/22 01/18/23  Garnette Gunner, MD  valACYclovir (VALTREX) 1000 MG tablet Take 1 tablet (1,000 mg  total) by mouth 3 (three) times daily. 12/04/22   Anyia Gierke H, PA-C  vitamin E (VITAMIN E) 400 UNIT capsule Take 400 Units by mouth daily.    [provider]      Allergies    Hydrocodone, Other, Penicillins, and Penicillamine    Review of Systems   Review of Systems  Musculoskeletal:  Positive for back pain and neck pain.  Skin:  Positive for rash.  All other systems reviewed and are negative.   Physical Exam Updated Vital Signs BP (!) 156/91 (BP Location: Left Arm)   Pulse (!) 120   Temp 98.7 F (37.1 C) (Oral)   Resp 16   Ht 5\' 4"  (1.626 m)   Wt 65.8 kg   SpO2 97%   BMI 24.89 kg/m  Physical Exam Vitals and nursing note reviewed.  Constitutional:      General: She is not in acute distress.    Appearance: Normal appearance.  HENT:     Head: Normocephalic and atraumatic.  Eyes:     General:        Right eye: No discharge.        Left eye: No discharge.  Cardiovascular:     Rate and Rhythm: Normal rate and regular rhythm.  Pulmonary:     Effort: Pulmonary effort is normal. No respiratory distress.  Musculoskeletal:        General: No deformity.  Comments: Tenderness To palpation along left-sided chest wall, left shoulder, and left back.  Skin:    General: Skin is warm and dry.     Comments: Vesicular rash tracking from left chest along left rib cage and to posterior back.  No active drainage from vesicles or signs of secondary infection.  Rash is in a dermatomal distribution.  Neurological:     Mental Status: She is alert and oriented to person, place, and time.  Psychiatric:        Mood and Affect: Mood normal.        Behavior: Behavior normal.     ED Results / Procedures / Treatments   Labs (all labs ordered are listed, but only abnormal results are displayed) Labs Reviewed - No data to display  EKG None  Radiology No results found.  Procedures Procedures    Medications Ordered in ED Medications  oxyCODONE-acetaminophen  (PERCOCET/ROXICET) 5-325 MG per tablet 2 tablet (2 tablets Oral Given 12/04/22 1315)    ED Course/ Medical Decision Making/ A&P                                 Medical Decision Making Risk Prescription drug management.   This patient is a 67 y.o. female who presents to the ED for concern of neck pain, back pain, rash across chest.   Differential diagnoses prior to evaluation: ACS, AAS, PE, Mallory-Weiss, Boerhaave's, Pneumonia, acute bronchitis, asthma or COPD exacerbation, anxiety, MSK pain or traumatic injury to the chest, acid reflux versus other, along with vesicular rash I am suspicious for herpes zoster  Past Medical History / Social History / Additional history: Chart reviewed. Pertinent results include: Multiple myeloma, stem cell transplant, chronic immunosuppression  Physical Exam: Physical exam performed. The pertinent findings include: Vesicular rash tracking from left chest along left rib cage and to posterior back.  No active drainage from vesicles or signs of secondary infection.  Rash is in a dermatomal distribution.   Tenderness to palpation along left-sided chest wall, left shoulder, and left back  Medications / Treatment: Will discharge with pain medication, Valtrex, discussed that I recommended she follow-up with her PCP/oncologist to see whether or not they would recommend any additional treatment with steroids.   Disposition: After consideration of the diagnostic results and the patients response to treatment, I feel that patient is stable for discharge with findings consistent with acute herpes zoster infection, likely secondary to the fact that she has not been taking her Valtrex.  Discharged with Percocet for pain control, discussed PCP follow-up for additional treatment as needed.  Discussed that it will resolve with time even with no treatment.Marland Kitchen   emergency department workup does not suggest an emergent condition requiring admission or immediate intervention  beyond what has been performed at this time. The plan is: as above. The patient is safe for discharge and has been instructed to return immediately for worsening symptoms, change in symptoms or any other concerns.  Final Clinical Impression(s) / ED Diagnoses Final diagnoses:  Herpes zoster without complication  Neck pain  Acute pain of left shoulder  Acute left-sided thoracic back pain    Rx / DC Orders ED Discharge Orders          Ordered    valACYclovir (VALTREX) 1000 MG tablet  3 times daily        12/04/22 1324    oxyCODONE-acetaminophen (PERCOCET/ROXICET) 5-325 MG tablet  Every 6 hours PRN  12/04/22 1324              Letonya Mangels, [place] H, PA-C 12/04/22 1348    Tanda Rockers A, DO 12/08/22 1255

## 2022-12-05 ENCOUNTER — Ambulatory Visit: Payer: Medicare HMO | Admitting: Physical Therapy

## 2022-12-08 ENCOUNTER — Ambulatory Visit: Payer: Medicare HMO | Admitting: Physical Therapy

## 2022-12-15 ENCOUNTER — Encounter: Payer: Medicare HMO | Admitting: Physical Therapy

## 2022-12-15 DIAGNOSIS — F419 Anxiety disorder, unspecified: Secondary | ICD-10-CM | POA: Diagnosis not present

## 2022-12-15 DIAGNOSIS — C9001 Multiple myeloma in remission: Secondary | ICD-10-CM | POA: Diagnosis not present

## 2022-12-15 DIAGNOSIS — Z9484 Stem cells transplant status: Secondary | ICD-10-CM | POA: Diagnosis not present

## 2022-12-15 DIAGNOSIS — B0229 Other postherpetic nervous system involvement: Secondary | ICD-10-CM | POA: Diagnosis not present

## 2022-12-15 DIAGNOSIS — G620 Drug-induced polyneuropathy: Secondary | ICD-10-CM | POA: Diagnosis not present

## 2022-12-17 ENCOUNTER — Encounter: Payer: Medicare HMO | Admitting: Physical Therapy

## 2022-12-23 DIAGNOSIS — C9001 Multiple myeloma in remission: Secondary | ICD-10-CM | POA: Diagnosis not present

## 2022-12-23 DIAGNOSIS — G620 Drug-induced polyneuropathy: Secondary | ICD-10-CM | POA: Diagnosis not present

## 2022-12-23 DIAGNOSIS — Z9484 Stem cells transplant status: Secondary | ICD-10-CM | POA: Diagnosis not present

## 2022-12-23 DIAGNOSIS — B028 Zoster with other complications: Secondary | ICD-10-CM | POA: Diagnosis not present

## 2022-12-23 DIAGNOSIS — D849 Immunodeficiency, unspecified: Secondary | ICD-10-CM | POA: Diagnosis not present

## 2022-12-23 DIAGNOSIS — B0229 Other postherpetic nervous system involvement: Secondary | ICD-10-CM | POA: Diagnosis not present

## 2022-12-23 DIAGNOSIS — G893 Neoplasm related pain (acute) (chronic): Secondary | ICD-10-CM | POA: Diagnosis not present

## 2023-01-05 DIAGNOSIS — R051 Acute cough: Secondary | ICD-10-CM | POA: Diagnosis not present

## 2023-01-19 ENCOUNTER — Other Ambulatory Visit: Payer: Self-pay | Admitting: Family Medicine

## 2023-01-19 DIAGNOSIS — F419 Anxiety disorder, unspecified: Secondary | ICD-10-CM

## 2023-01-19 DIAGNOSIS — C9001 Multiple myeloma in remission: Secondary | ICD-10-CM | POA: Diagnosis not present

## 2023-01-19 DIAGNOSIS — D696 Thrombocytopenia, unspecified: Secondary | ICD-10-CM | POA: Diagnosis not present

## 2023-01-19 DIAGNOSIS — Z9484 Stem cells transplant status: Secondary | ICD-10-CM | POA: Diagnosis not present

## 2023-01-19 DIAGNOSIS — D801 Nonfamilial hypogammaglobulinemia: Secondary | ICD-10-CM | POA: Diagnosis not present

## 2023-02-05 DIAGNOSIS — C9001 Multiple myeloma in remission: Secondary | ICD-10-CM | POA: Diagnosis not present

## 2023-02-05 DIAGNOSIS — Z48290 Encounter for aftercare following bone marrow transplant: Secondary | ICD-10-CM | POA: Diagnosis not present

## 2023-02-05 DIAGNOSIS — D638 Anemia in other chronic diseases classified elsewhere: Secondary | ICD-10-CM | POA: Diagnosis not present

## 2023-02-16 DIAGNOSIS — D801 Nonfamilial hypogammaglobulinemia: Secondary | ICD-10-CM | POA: Diagnosis not present

## 2023-02-16 DIAGNOSIS — C9001 Multiple myeloma in remission: Secondary | ICD-10-CM | POA: Diagnosis not present

## 2023-02-16 DIAGNOSIS — Z9484 Stem cells transplant status: Secondary | ICD-10-CM | POA: Diagnosis not present

## 2023-02-18 DIAGNOSIS — Z9484 Stem cells transplant status: Secondary | ICD-10-CM | POA: Diagnosis not present

## 2023-02-18 DIAGNOSIS — G909 Disorder of the autonomic nervous system, unspecified: Secondary | ICD-10-CM | POA: Diagnosis not present

## 2023-02-18 DIAGNOSIS — G620 Drug-induced polyneuropathy: Secondary | ICD-10-CM | POA: Diagnosis not present

## 2023-02-18 DIAGNOSIS — B0229 Other postherpetic nervous system involvement: Secondary | ICD-10-CM | POA: Diagnosis not present

## 2023-02-18 DIAGNOSIS — D801 Nonfamilial hypogammaglobulinemia: Secondary | ICD-10-CM | POA: Diagnosis not present

## 2023-02-18 DIAGNOSIS — C9001 Multiple myeloma in remission: Secondary | ICD-10-CM | POA: Diagnosis not present

## 2023-02-18 DIAGNOSIS — Z48290 Encounter for aftercare following bone marrow transplant: Secondary | ICD-10-CM | POA: Diagnosis not present

## 2023-03-10 DIAGNOSIS — C9001 Multiple myeloma in remission: Secondary | ICD-10-CM | POA: Diagnosis not present

## 2023-03-10 DIAGNOSIS — Z48290 Encounter for aftercare following bone marrow transplant: Secondary | ICD-10-CM | POA: Diagnosis not present

## 2023-03-10 DIAGNOSIS — D638 Anemia in other chronic diseases classified elsewhere: Secondary | ICD-10-CM | POA: Diagnosis not present

## 2023-03-11 DIAGNOSIS — J029 Acute pharyngitis, unspecified: Secondary | ICD-10-CM | POA: Diagnosis not present

## 2023-03-11 DIAGNOSIS — Z1330 Encounter for screening examination for mental health and behavioral disorders, unspecified: Secondary | ICD-10-CM | POA: Diagnosis not present

## 2023-03-11 DIAGNOSIS — B370 Candidal stomatitis: Secondary | ICD-10-CM | POA: Diagnosis not present

## 2023-03-12 DIAGNOSIS — R339 Retention of urine, unspecified: Secondary | ICD-10-CM | POA: Diagnosis not present

## 2023-03-12 DIAGNOSIS — Z0000 Encounter for general adult medical examination without abnormal findings: Secondary | ICD-10-CM | POA: Diagnosis not present

## 2023-03-12 DIAGNOSIS — Z13820 Encounter for screening for osteoporosis: Secondary | ICD-10-CM | POA: Diagnosis not present

## 2023-03-12 DIAGNOSIS — Z13220 Encounter for screening for lipoid disorders: Secondary | ICD-10-CM | POA: Diagnosis not present

## 2023-03-12 DIAGNOSIS — C9001 Multiple myeloma in remission: Secondary | ICD-10-CM | POA: Diagnosis not present

## 2023-03-17 DIAGNOSIS — C9001 Multiple myeloma in remission: Secondary | ICD-10-CM | POA: Diagnosis not present

## 2023-03-17 DIAGNOSIS — G620 Drug-induced polyneuropathy: Secondary | ICD-10-CM | POA: Diagnosis not present

## 2023-03-17 DIAGNOSIS — D801 Nonfamilial hypogammaglobulinemia: Secondary | ICD-10-CM | POA: Diagnosis not present

## 2023-04-01 DIAGNOSIS — I10 Essential (primary) hypertension: Secondary | ICD-10-CM | POA: Diagnosis not present

## 2023-04-01 DIAGNOSIS — R21 Rash and other nonspecific skin eruption: Secondary | ICD-10-CM | POA: Diagnosis not present

## 2023-04-02 DIAGNOSIS — R319 Hematuria, unspecified: Secondary | ICD-10-CM | POA: Diagnosis not present

## 2023-04-13 DIAGNOSIS — R350 Frequency of micturition: Secondary | ICD-10-CM | POA: Diagnosis not present

## 2023-04-13 DIAGNOSIS — Z9484 Stem cells transplant status: Secondary | ICD-10-CM | POA: Diagnosis not present

## 2023-04-13 DIAGNOSIS — D801 Nonfamilial hypogammaglobulinemia: Secondary | ICD-10-CM | POA: Diagnosis not present

## 2023-04-13 DIAGNOSIS — C9001 Multiple myeloma in remission: Secondary | ICD-10-CM | POA: Diagnosis not present

## 2023-04-14 DIAGNOSIS — J029 Acute pharyngitis, unspecified: Secondary | ICD-10-CM | POA: Diagnosis not present

## 2023-04-14 DIAGNOSIS — R059 Cough, unspecified: Secondary | ICD-10-CM | POA: Diagnosis not present

## 2023-04-17 ENCOUNTER — Other Ambulatory Visit: Payer: Self-pay | Admitting: Family Medicine

## 2023-04-17 DIAGNOSIS — F419 Anxiety disorder, unspecified: Secondary | ICD-10-CM

## 2023-04-23 DIAGNOSIS — C9001 Multiple myeloma in remission: Secondary | ICD-10-CM | POA: Diagnosis not present

## 2023-04-23 DIAGNOSIS — Z48290 Encounter for aftercare following bone marrow transplant: Secondary | ICD-10-CM | POA: Diagnosis not present

## 2023-04-23 DIAGNOSIS — D638 Anemia in other chronic diseases classified elsewhere: Secondary | ICD-10-CM | POA: Diagnosis not present

## 2023-04-28 DIAGNOSIS — D801 Nonfamilial hypogammaglobulinemia: Secondary | ICD-10-CM | POA: Diagnosis not present

## 2023-04-28 DIAGNOSIS — C9001 Multiple myeloma in remission: Secondary | ICD-10-CM | POA: Diagnosis not present

## 2023-04-28 DIAGNOSIS — Z9484 Stem cells transplant status: Secondary | ICD-10-CM | POA: Diagnosis not present

## 2023-04-28 DIAGNOSIS — J392 Other diseases of pharynx: Secondary | ICD-10-CM | POA: Diagnosis not present

## 2023-04-28 DIAGNOSIS — R053 Chronic cough: Secondary | ICD-10-CM | POA: Diagnosis not present

## 2023-04-28 DIAGNOSIS — G620 Drug-induced polyneuropathy: Secondary | ICD-10-CM | POA: Diagnosis not present

## 2023-04-28 DIAGNOSIS — Z48290 Encounter for aftercare following bone marrow transplant: Secondary | ICD-10-CM | POA: Diagnosis not present

## 2023-05-01 DIAGNOSIS — R059 Cough, unspecified: Secondary | ICD-10-CM | POA: Diagnosis not present

## 2023-05-01 DIAGNOSIS — J3489 Other specified disorders of nose and nasal sinuses: Secondary | ICD-10-CM | POA: Diagnosis not present

## 2023-05-01 DIAGNOSIS — K219 Gastro-esophageal reflux disease without esophagitis: Secondary | ICD-10-CM | POA: Diagnosis not present

## 2023-05-19 DIAGNOSIS — D801 Nonfamilial hypogammaglobulinemia: Secondary | ICD-10-CM | POA: Diagnosis not present

## 2023-05-19 DIAGNOSIS — D849 Immunodeficiency, unspecified: Secondary | ICD-10-CM | POA: Diagnosis not present

## 2023-05-19 DIAGNOSIS — R053 Chronic cough: Secondary | ICD-10-CM | POA: Diagnosis not present

## 2023-05-19 DIAGNOSIS — Z9484 Stem cells transplant status: Secondary | ICD-10-CM | POA: Diagnosis not present

## 2023-05-19 DIAGNOSIS — R21 Rash and other nonspecific skin eruption: Secondary | ICD-10-CM | POA: Diagnosis not present

## 2023-05-19 DIAGNOSIS — C9001 Multiple myeloma in remission: Secondary | ICD-10-CM | POA: Diagnosis not present

## 2023-05-19 DIAGNOSIS — G620 Drug-induced polyneuropathy: Secondary | ICD-10-CM | POA: Diagnosis not present

## 2023-05-19 DIAGNOSIS — M2550 Pain in unspecified joint: Secondary | ICD-10-CM | POA: Diagnosis not present

## 2023-05-21 DIAGNOSIS — L570 Actinic keratosis: Secondary | ICD-10-CM | POA: Diagnosis not present

## 2023-05-21 DIAGNOSIS — L308 Other specified dermatitis: Secondary | ICD-10-CM | POA: Diagnosis not present

## 2023-05-28 DIAGNOSIS — L570 Actinic keratosis: Secondary | ICD-10-CM | POA: Diagnosis not present

## 2023-05-28 DIAGNOSIS — L821 Other seborrheic keratosis: Secondary | ICD-10-CM | POA: Diagnosis not present

## 2023-05-28 DIAGNOSIS — Z129 Encounter for screening for malignant neoplasm, site unspecified: Secondary | ICD-10-CM | POA: Diagnosis not present

## 2023-05-28 DIAGNOSIS — D225 Melanocytic nevi of trunk: Secondary | ICD-10-CM | POA: Diagnosis not present

## 2023-06-06 DIAGNOSIS — Z48290 Encounter for aftercare following bone marrow transplant: Secondary | ICD-10-CM | POA: Diagnosis not present

## 2023-06-06 DIAGNOSIS — D638 Anemia in other chronic diseases classified elsewhere: Secondary | ICD-10-CM | POA: Diagnosis not present

## 2023-06-06 DIAGNOSIS — C9000 Multiple myeloma not having achieved remission: Secondary | ICD-10-CM | POA: Diagnosis not present

## 2023-07-01 DIAGNOSIS — J101 Influenza due to other identified influenza virus with other respiratory manifestations: Secondary | ICD-10-CM | POA: Diagnosis not present

## 2023-07-01 DIAGNOSIS — R0981 Nasal congestion: Secondary | ICD-10-CM | POA: Diagnosis not present

## 2023-07-01 DIAGNOSIS — B349 Viral infection, unspecified: Secondary | ICD-10-CM | POA: Diagnosis not present

## 2023-07-01 DIAGNOSIS — R051 Acute cough: Secondary | ICD-10-CM | POA: Diagnosis not present

## 2023-07-01 DIAGNOSIS — J9801 Acute bronchospasm: Secondary | ICD-10-CM | POA: Diagnosis not present

## 2023-07-07 DIAGNOSIS — J40 Bronchitis, not specified as acute or chronic: Secondary | ICD-10-CM | POA: Diagnosis not present

## 2023-07-07 DIAGNOSIS — C9001 Multiple myeloma in remission: Secondary | ICD-10-CM | POA: Diagnosis not present

## 2023-07-15 DIAGNOSIS — J40 Bronchitis, not specified as acute or chronic: Secondary | ICD-10-CM | POA: Diagnosis not present

## 2023-07-16 DIAGNOSIS — R059 Cough, unspecified: Secondary | ICD-10-CM | POA: Diagnosis not present

## 2023-07-16 DIAGNOSIS — J40 Bronchitis, not specified as acute or chronic: Secondary | ICD-10-CM | POA: Diagnosis not present

## 2023-07-20 DIAGNOSIS — C9001 Multiple myeloma in remission: Secondary | ICD-10-CM | POA: Diagnosis not present

## 2023-07-30 DIAGNOSIS — Z780 Asymptomatic menopausal state: Secondary | ICD-10-CM | POA: Diagnosis not present

## 2023-07-30 DIAGNOSIS — M8580 Other specified disorders of bone density and structure, unspecified site: Secondary | ICD-10-CM | POA: Diagnosis not present

## 2023-07-30 DIAGNOSIS — R232 Flushing: Secondary | ICD-10-CM | POA: Diagnosis not present

## 2023-07-30 DIAGNOSIS — E663 Overweight: Secondary | ICD-10-CM | POA: Diagnosis not present

## 2023-08-12 ENCOUNTER — Other Ambulatory Visit: Payer: Self-pay | Admitting: Family Medicine

## 2023-08-12 DIAGNOSIS — M8580 Other specified disorders of bone density and structure, unspecified site: Secondary | ICD-10-CM

## 2023-08-18 DIAGNOSIS — C9001 Multiple myeloma in remission: Secondary | ICD-10-CM | POA: Diagnosis not present

## 2023-08-18 DIAGNOSIS — D849 Immunodeficiency, unspecified: Secondary | ICD-10-CM | POA: Diagnosis not present

## 2023-08-18 DIAGNOSIS — Z9484 Stem cells transplant status: Secondary | ICD-10-CM | POA: Diagnosis not present

## 2023-08-18 DIAGNOSIS — Z7189 Other specified counseling: Secondary | ICD-10-CM | POA: Diagnosis not present

## 2023-08-18 DIAGNOSIS — R21 Rash and other nonspecific skin eruption: Secondary | ICD-10-CM | POA: Diagnosis not present

## 2023-08-18 DIAGNOSIS — G620 Drug-induced polyneuropathy: Secondary | ICD-10-CM | POA: Diagnosis not present

## 2023-08-18 DIAGNOSIS — D801 Nonfamilial hypogammaglobulinemia: Secondary | ICD-10-CM | POA: Diagnosis not present

## 2023-08-20 DIAGNOSIS — H524 Presbyopia: Secondary | ICD-10-CM | POA: Diagnosis not present

## 2023-08-20 DIAGNOSIS — H5203 Hypermetropia, bilateral: Secondary | ICD-10-CM | POA: Diagnosis not present

## 2023-10-16 ENCOUNTER — Encounter (HOSPITAL_BASED_OUTPATIENT_CLINIC_OR_DEPARTMENT_OTHER): Payer: Self-pay

## 2023-10-16 ENCOUNTER — Emergency Department (HOSPITAL_BASED_OUTPATIENT_CLINIC_OR_DEPARTMENT_OTHER)

## 2023-10-16 ENCOUNTER — Other Ambulatory Visit: Payer: Self-pay

## 2023-10-16 ENCOUNTER — Emergency Department (HOSPITAL_BASED_OUTPATIENT_CLINIC_OR_DEPARTMENT_OTHER)
Admission: EM | Admit: 2023-10-16 | Discharge: 2023-10-16 | Disposition: A | Discharge status: Home / Self Care | Attending: Emergency Medicine | Admitting: Emergency Medicine

## 2023-10-16 DIAGNOSIS — Z859 Personal history of malignant neoplasm, unspecified: Secondary | ICD-10-CM | POA: Diagnosis not present

## 2023-10-16 DIAGNOSIS — R0789 Other chest pain: Secondary | ICD-10-CM | POA: Insufficient documentation

## 2023-10-16 DIAGNOSIS — S0181XA Laceration without foreign body of other part of head, initial encounter: Secondary | ICD-10-CM | POA: Diagnosis not present

## 2023-10-16 DIAGNOSIS — S01511A Laceration without foreign body of lip, initial encounter: Secondary | ICD-10-CM | POA: Insufficient documentation

## 2023-10-16 DIAGNOSIS — Y9301 Activity, walking, marching and hiking: Secondary | ICD-10-CM | POA: Diagnosis not present

## 2023-10-16 DIAGNOSIS — Z79899 Other long term (current) drug therapy: Secondary | ICD-10-CM | POA: Diagnosis not present

## 2023-10-16 DIAGNOSIS — S060XAA Concussion with loss of consciousness status unknown, initial encounter: Secondary | ICD-10-CM | POA: Diagnosis not present

## 2023-10-16 DIAGNOSIS — S0990XA Unspecified injury of head, initial encounter: Secondary | ICD-10-CM | POA: Diagnosis present

## 2023-10-16 DIAGNOSIS — I10 Essential (primary) hypertension: Secondary | ICD-10-CM | POA: Diagnosis not present

## 2023-10-16 DIAGNOSIS — W19XXXA Unspecified fall, initial encounter: Secondary | ICD-10-CM | POA: Insufficient documentation

## 2023-10-16 LAB — CBG MONITORING, ED: Glucose-Capillary: 98 mg/dL (ref 70–99)

## 2023-10-16 MED ORDER — CEPHALEXIN 500 MG PO CAPS
500.0000 mg | ORAL_CAPSULE | Freq: Three times a day (TID) | ORAL | 0 refills | Status: AC
Start: 2023-10-16 — End: 2023-10-23

## 2023-10-16 MED ORDER — LIDOCAINE-EPINEPHRINE-TETRACAINE (LET) TOPICAL GEL
3.0000 mL | Freq: Once | TOPICAL | Status: AC
  Administered 2023-10-16: 3 mL via TOPICAL
  Filled 2023-10-16: qty 3

## 2023-10-16 MED ORDER — ACETAMINOPHEN 500 MG PO TABS
1000.0000 mg | ORAL_TABLET | Freq: Once | ORAL | Status: AC
  Administered 2023-10-16: 1000 mg via ORAL
  Filled 2023-10-16: qty 2

## 2023-10-16 MED ORDER — LIDOCAINE-EPINEPHRINE (PF) 2 %-1:200000 IJ SOLN
10.0000 mL | Freq: Once | INTRAMUSCULAR | Status: AC
  Administered 2023-10-16: 10 mL
  Filled 2023-10-16: qty 20

## 2023-10-16 MED ORDER — OXYCODONE HCL 5 MG PO TABS: 5.0000 mg | ORAL_TABLET | ORAL | 0 refills | Status: AC | PRN

## 2023-10-16 NOTE — Discharge Instructions (Addendum)
 CT scans and chest x-rays did not show any concerning findings.  Given your chest wall pain you may potentially have a bruised or an isolated rib fracture.  I have sent pain medicine into the pharmacy for you.  This will make you drowsy so be careful when you take this and do not do anything dangerous.  It does make you an increased fall risk.  Primarily take Tylenol  1000 mg every 6 hours along with 600 mg of ibuprofen in addition to that if needed.  The sutures will need to be removed in 7 days.  You can have your primary care doctor remove these or you can return to the emergency department.  Given the force that you struck your head you may have a concussion.  Symptoms to expect from this organ to be headache that gets worse when you attempt to focus on something, headache that gets better if you lay in a dark lit room.  Occasional nausea and vomiting that is not unusual for persistent nausea and vomiting to the point you are unable to tolerate any food or drink is concerning and you should come back for evaluation.  Symptoms should last no more than 1 week.

## 2023-10-16 NOTE — ED Triage Notes (Signed)
 Arrived POV . Accompanied by sister. Pt unsure what caused her to fall. States my stupid feet. Remembers falling coming out of laundry room, but doesn't remember details Laceration to right lower lip .Pain in right wrist and back  Family says she was confused unable to state where she was at the time and details of fall Disoriented to location now says we are in [place].Oriented to person . Incorrect year states 2026 Denies use of blood thinners

## 2023-10-16 NOTE — ED Notes (Signed)
 Pt. Reports she had a fall today onto hardwood floor causing injury to the face and R inside of lip with pain in the R wrist.

## 2023-10-16 NOTE — ED Notes (Signed)
 RT gave patient IS at this time. Patient stated she had no questions about use and had done it before. Not done at this moment due to lip pain.

## 2023-10-16 NOTE — ED Provider Notes (Signed)
 [facility] Provider Note   CSN: [id number] Arrival date & time: 10/16/23  1421     Patient presents with: Anna Castillo is a 68 y.o. female.   68 year old female presents with her sister for concern of a fall that occurred just prior to arrival.  She states she was walking back into the house after starting her car.  Denies any prodromal symptoms.  Denies chest pain, shortness of breath, palpitations.  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  Has a laceration to her lip and reports some right-sided chest wall pain.  Denies any other complaints.  Sister states that she has mention some things that were inappropriate and made her think she is somewhat confused since the fall however this is improving according to her.  Not on any blood thinners.  She is unsure about loss of consciousness.  Her sister states that she heard a loud scream so she ran to the patient.  No significant downtime.  Denies any recent illness.  No recent cough, no dysuria.   The history is provided by the patient. No language interpreter was used.       Prior to Admission medications   Medication Sig Start Date End Date Taking? Authorizing Provider  cephALEXin  (KEFLEX ) 500 MG capsule Take 1 capsule (500 mg total) by mouth 3 (three) times daily for 7 days. 10/16/23 10/23/23 Yes Kriste Broman, PA-C  oxyCODONE  (ROXICODONE ) 5 MG immediate release tablet Take 1 tablet (5 mg total) by mouth every 4 (four) hours as needed for severe pain (pain score 7-10). 10/16/23  Yes Hildegard, Zeus Marquis, PA-C  ALPRAZolam (XANAX) 0.25 MG tablet Take 0.25 mg by mouth at bedtime as needed for sleep. Patient not taking: Reported on 11/24/2022    [provider]  LORazepam (ATIVAN) 0.5 MG tablet TAKE 1 TABLET BY MOUTH 3 TIMES A DAY AS NEEDED FOR ANXIETY OR NAUSEA FOR UP TO 10 DAYS, MAX DAILY AMOUNT  1.5MG  09/11/22   [provider]  omeprazole (PRILOSEC OTC) 20 MG tablet Take 20 mg by mouth daily.    [provider]  ondansetron  (ZOFRAN ) 4 MG tablet Take 1 tablet (4 mg total) by mouth every 6 (six) hours. 03/25/18   Laurice Maude BROCKS, MD  pregabalin (LYRICA) 75 MG capsule Take by mouth. 10/08/22 11/07/22  [provider]  propranolol (INDERAL) 10 MG tablet Take 1 tablet by mouth 3 (three) times daily. 09/17/22   [provider]  sertraline  (ZOLOFT ) 50 MG tablet TAKE 1 AND 1/2 TABLET BY MOUTH DAILY 01/19/23   Sebastian Beverley NOVAK, MD  valACYclovir  (VALTREX ) 1000 MG tablet Take 1 tablet (1,000 mg total) by mouth 3 (three) times daily. 12/04/22   Prosperi, Christian H, PA-C  vitamin E (VITAMIN E) 400 UNIT capsule Take 400 Units by mouth daily.    [provider]    Allergies: Hydrocodone, Other, Penicillins, and Penicillamine    Review of Systems  Constitutional:  Negative for fever.  Respiratory:  Negative for shortness of breath.   Cardiovascular:  Positive for chest pain (Right-sided chest wall pain).  Gastrointestinal:  Negative for abdominal pain.  Neurological:  Positive for headaches. Negative for syncope.  All other systems reviewed and are negative.   Updated Vital Signs BP (!) 126/90 (BP Location: Left Arm)   Pulse (!) 102   Temp (!)  97.5 F (36.4 C)   Resp 16   Wt 74.8 kg   SpO2 99%   BMI 28.32 kg/m   Physical Exam Vitals and nursing note reviewed.  Constitutional:      General: She is not in acute distress.    Appearance: Normal appearance. She is not ill-appearing.  HENT:     Head: Normocephalic and atraumatic.     Nose: Nose normal.   Eyes:     Conjunctiva/sclera: Conjunctivae normal.   Pulmonary:     Effort: Pulmonary effort is normal. No respiratory distress.   Musculoskeletal:        General: No deformity.     Comments: Cervical, thoracic, lumbar spine without tenderness to palpation.  Good range of motion in the  right wrist.  Mild tenderness palpation but no bruising, deformity.  No tenderness over the anatomical snuffbox.  No tenderness over the elbow or other major joints.  All other major joints with good range of motion.  No bruising of the chest wall.   Skin:    Findings: No rash.   Neurological:     Mental Status: She is alert.     (all labs ordered are listed, but only abnormal results are displayed) Labs Reviewed  CBG MONITORING, ED    EKG: None  Radiology: CT Head Wo Contrast Result Date: 10/16/2023 CLINICAL DATA:  Trauma, mechanical fall, laceration to right lower lobe. EXAM: CT HEAD WITHOUT CONTRAST CT MAXILLOFACIAL WITHOUT CONTRAST CT CERVICAL SPINE WITHOUT CONTRAST TECHNIQUE: Multidetector CT imaging of the head, cervical spine, and maxillofacial structures were performed using the standard protocol without intravenous contrast. Multiplanar CT image reconstructions of the cervical spine and maxillofacial structures were also generated. RADIATION DOSE REDUCTION: This exam was performed according to the departmental dose-optimization program which includes automated exposure control, adjustment of the mA and/or kV according to patient size and/or use of iterative reconstruction technique. COMPARISON:  MRI head 11/29/2014. FINDINGS: CT HEAD FINDINGS Brain: No acute intracranial hemorrhage. No CT evidence of acute infarct. No edema, mass effect, or midline shift. The basilar cisterns are patent. Ventricles: The ventricles are normal. Vascular: Atherosclerotic calcifications of the carotid siphons. No hyperdense vessel. Skull: No acute or aggressive finding. Other: Mastoid air cells are clear. CT MAXILLOFACIAL FINDINGS Osseous: Maxilla: Intact Pterygoid Plates: Intact Zygomatic Arch: Intact Orbits: Intact Ethmoid: Intact Sphenoid: Intact Frontal:Intact Mandible: Intact. No condylar dislocation. Nasal: Intact Nasal Septum: Intact. No significant deviation. Orbits: Negative. No traumatic or  inflammatory finding. Sinuses: No significant mucosal thickening.  No air-fluid levels. Soft tissues: Negative. CT CERVICAL SPINE FINDINGS Alignment: Straightening of the normal cervical lordosis. Trace retrolisthesis of C4 on C5. No facet subluxation or dislocation. Skull base and vertebrae: No acute fracture. No primary bone lesion or focal pathologic process. Soft tissues and spinal canal: No prevertebral fluid or swelling. No visible canal hematoma. Disc levels: Intervertebral disc space narrowing most pronounced at C4-5 and C5-6. Disc osteophyte complexes at C4-5 and C5-6 and C6-7 without high-grade osseous spinal canal stenosis. Facet arthrosis and uncovertebral hypertrophy at multiple levels. Upper chest: Negative. Other: None. IMPRESSION: No CT evidence of acute intracranial abnormality. No acute maxillofacial fracture. No acute fracture or traumatic malalignment of the cervical spine. Electronically Signed   By: Donnice Mania M.D.   On: 10/16/2023 16:06   CT Cervical Spine Wo Contrast Result Date: 10/16/2023 CLINICAL DATA:  Trauma, mechanical fall, laceration to right lower lobe. EXAM: CT HEAD WITHOUT CONTRAST CT MAXILLOFACIAL WITHOUT CONTRAST CT CERVICAL SPINE WITHOUT CONTRAST TECHNIQUE: Multidetector  CT imaging of the head, cervical spine, and maxillofacial structures were performed using the standard protocol without intravenous contrast. Multiplanar CT image reconstructions of the cervical spine and maxillofacial structures were also generated. RADIATION DOSE REDUCTION: This exam was performed according to the departmental dose-optimization program which includes automated exposure control, adjustment of the mA and/or kV according to patient size and/or use of iterative reconstruction technique. COMPARISON:  MRI head 11/29/2014. FINDINGS: CT HEAD FINDINGS Brain: No acute intracranial hemorrhage. No CT evidence of acute infarct. No edema, mass effect, or midline shift. The basilar cisterns are patent.  Ventricles: The ventricles are normal. Vascular: Atherosclerotic calcifications of the carotid siphons. No hyperdense vessel. Skull: No acute or aggressive finding. Other: Mastoid air cells are clear. CT MAXILLOFACIAL FINDINGS Osseous: Maxilla: Intact Pterygoid Plates: Intact Zygomatic Arch: Intact Orbits: Intact Ethmoid: Intact Sphenoid: Intact Frontal:Intact Mandible: Intact. No condylar dislocation. Nasal: Intact Nasal Septum: Intact. No significant deviation. Orbits: Negative. No traumatic or inflammatory finding. Sinuses: No significant mucosal thickening.  No air-fluid levels. Soft tissues: Negative. CT CERVICAL SPINE FINDINGS Alignment: Straightening of the normal cervical lordosis. Trace retrolisthesis of C4 on C5. No facet subluxation or dislocation. Skull base and vertebrae: No acute fracture. No primary bone lesion or focal pathologic process. Soft tissues and spinal canal: No prevertebral fluid or swelling. No visible canal hematoma. Disc levels: Intervertebral disc space narrowing most pronounced at C4-5 and C5-6. Disc osteophyte complexes at C4-5 and C5-6 and C6-7 without high-grade osseous spinal canal stenosis. Facet arthrosis and uncovertebral hypertrophy at multiple levels. Upper chest: Negative. Other: None. IMPRESSION: No CT evidence of acute intracranial abnormality. No acute maxillofacial fracture. No acute fracture or traumatic malalignment of the cervical spine. Electronically Signed   By: Donnice Mania M.D.   On: 10/16/2023 16:06   CT Maxillofacial Wo Contrast Result Date: 10/16/2023 CLINICAL DATA:  Trauma, mechanical fall, laceration to right lower lobe. EXAM: CT HEAD WITHOUT CONTRAST CT MAXILLOFACIAL WITHOUT CONTRAST CT CERVICAL SPINE WITHOUT CONTRAST TECHNIQUE: Multidetector CT imaging of the head, cervical spine, and maxillofacial structures were performed using the standard protocol without intravenous contrast. Multiplanar CT image reconstructions of the cervical spine and  maxillofacial structures were also generated. RADIATION DOSE REDUCTION: This exam was performed according to the departmental dose-optimization program which includes automated exposure control, adjustment of the mA and/or kV according to patient size and/or use of iterative reconstruction technique. COMPARISON:  MRI head 11/29/2014. FINDINGS: CT HEAD FINDINGS Brain: No acute intracranial hemorrhage. No CT evidence of acute infarct. No edema, mass effect, or midline shift. The basilar cisterns are patent. Ventricles: The ventricles are normal. Vascular: Atherosclerotic calcifications of the carotid siphons. No hyperdense vessel. Skull: No acute or aggressive finding. Other: Mastoid air cells are clear. CT MAXILLOFACIAL FINDINGS Osseous: Maxilla: Intact Pterygoid Plates: Intact Zygomatic Arch: Intact Orbits: Intact Ethmoid: Intact Sphenoid: Intact Frontal:Intact Mandible: Intact. No condylar dislocation. Nasal: Intact Nasal Septum: Intact. No significant deviation. Orbits: Negative. No traumatic or inflammatory finding. Sinuses: No significant mucosal thickening.  No air-fluid levels. Soft tissues: Negative. CT CERVICAL SPINE FINDINGS Alignment: Straightening of the normal cervical lordosis. Trace retrolisthesis of C4 on C5. No facet subluxation or dislocation. Skull base and vertebrae: No acute fracture. No primary bone lesion or focal pathologic process. Soft tissues and spinal canal: No prevertebral fluid or swelling. No visible canal hematoma. Disc levels: Intervertebral disc space narrowing most pronounced at C4-5 and C5-6. Disc osteophyte complexes at C4-5 and C5-6 and C6-7 without high-grade osseous spinal canal stenosis. Facet arthrosis and  uncovertebral hypertrophy at multiple levels. Upper chest: Negative. Other: None. IMPRESSION: No CT evidence of acute intracranial abnormality. No acute maxillofacial fracture. No acute fracture or traumatic malalignment of the cervical spine. Electronically Signed   By:  Donnice Mania M.D.   On: 10/16/2023 16:06   DG Chest 2 View Result Date: 10/16/2023 CLINICAL DATA:  fall; right chest wall pain; wrist pain EXAM: CHEST - 2 VIEW COMPARISON:  None available. FINDINGS: No focal airspace consolidation, pleural effusion, or pneumothorax. No cardiomegaly.No acute fracture or destructive lesion. Cholecystectomy clips. Multilevel thoracic osteophytosis. IMPRESSION: No acute cardiopulmonary abnormality. Electronically Signed   By: Rogelia Myers M.D.   On: 10/16/2023 15:52   DG Wrist Complete Right Result Date: 10/16/2023 CLINICAL DATA:  fall; right chest wall pain; wrist pain EXAM: RIGHT WRIST - COMPLETE 3+ VIEW COMPARISON:  None Available. FINDINGS: No acute fracture or dislocation. There is no evidence of arthropathy or other focal bone abnormality. Soft tissues are unremarkable. IMPRESSION: No acute fracture or dislocation. Electronically Signed   By: Rogelia Myers M.D.   On: 10/16/2023 15:50     .SABRALac repair Altin Sease  Date/Time: 10/16/2023 5:44 PM  Performed by: Hildegard Loge, PA-C Authorized by: Hildegard Loge, PA-C   Consent:    Consent obtained:  Verbal   Consent given by:  Patient   Risks discussed:  Need for additional repair, infection, retained foreign body, poor cosmetic result and poor wound healing   Alternatives discussed:  No treatment Universal protocol:    Procedure explained and questions answered to patient or proxy's satisfaction: yes     Relevant documents present and verified: yes     Patient identity confirmed:  Verbally with patient and arm band Laceration details:    Length (cm):  2.6 Pre-procedure details:    Preparation:  Patient was prepped and draped in usual sterile fashion Treatment:    Area cleansed with:  Saline and povidone-iodine   Amount of cleaning:  Extensive   Irrigation solution:  Sterile saline   Irrigation volume:  250   Irrigation method:  Tap   Debridement:  None   Undermining:  None Skin repair:    Repair method:   Sutures   Suture size:  5-0   Suture material:  Prolene and fast-absorbing gut   Suture technique:  Simple interrupted   Number of sutures:  4 Approximation:    Approximation:  Close Repair type:    Repair type:  Simple Post-procedure details:    Dressing:  Open (no dressing)   Procedure completion:  Tolerated well, no immediate complications Comments:     1 nonabsorbable suture to approximate the vermilion border.  3 absorbable sutures to approximate the lip lac .SABRALac repair Cable Fearn  Date/Time: 10/16/2023 5:45 PM  Performed by: Hildegard Loge, PA-C Authorized by: Hildegard Loge, PA-C   Consent:    Consent obtained:  Verbal   Consent given by:  Patient   Risks discussed:  Need for additional repair, infection, retained foreign body, poor cosmetic result and poor wound healing   Alternatives discussed:  No treatment Universal protocol:    Procedure explained and questions answered to patient or proxy's satisfaction: yes     Relevant documents present and verified: yes     Patient identity confirmed:  Verbally with patient and arm band Laceration details:    Length (cm):  3 Pre-procedure details:    Preparation:  Patient was prepped and draped in usual sterile fashion Treatment:    Area cleansed with:  Saline and  povidone-iodine   Amount of cleaning:  Extensive   Irrigation solution:  Sterile saline   Irrigation volume:  250   Irrigation method:  Tap   Debridement:  None   Undermining:  None Skin repair:    Repair method:  Sutures   Suture size:  5-0   Suture material:  Prolene   Suture technique:  Simple interrupted   Number of sutures:  3 Approximation:    Approximation:  Close Repair type:    Repair type:  Simple Post-procedure details:    Dressing:  Antibiotic ointment   Procedure completion:  Tolerated well, no immediate complications    Medications Ordered in the ED  acetaminophen  (TYLENOL ) tablet 1,000 mg (1,000 mg Oral Given 10/16/23 1552)  lidocaine -EPINEPHrine   (XYLOCAINE  W/EPI) 2 %-1:200000 (PF) injection 10 mL (10 mLs Infiltration Given 10/16/23 1553)  lidocaine -EPINEPHrine -tetracaine  (LET) topical gel (3 mLs Topical Given 10/16/23 1553)                                    Medical Decision Making Amount and/or Complexity of Data Reviewed Radiology: ordered.  Risk OTC drugs. Prescription drug management.   Medical Decision Making / ED Course   This patient presents to the ED for concern of fall, this involves an extensive number of treatment options, and is a complaint that carries with it a high risk of complications and morbidity.  The differential diagnosis includes laceration, concussion, contusion, fracture  MDM: 68 year old female presents today after a fall.  Has history of mechanical falls.  States this was more than likely a mechanical fall.  No prodromal symptoms. Fell onto concrete floor.  Has a laceration to lower lip on the right side.  Appears to be a through and through laceration.  Does cross the vermilion border.  Repaired with 7 total stitches.  Or 2 components to this laceration since it was through and through. Chest x-ray, right wrist x-ray without acute finding.  CT head, CT C-spine, CT maxillofacial without acute finding. Patient likely has a concussion. Expected course and management discussed. Maybe has a contusion or isolated rib fracture that was not seen on x-ray.  Given pain medicine as well as incentive spirometer.  Discussed close follow-up with PCP. Discharged in stable condition.  Return precaution discussed.  Patient voices understanding and is in agreement with plan.  Lab Tests: -I ordered, reviewed, and interpreted labs.   The pertinent results include:   Labs Reviewed  CBG MONITORING, ED      EKG  EKG Interpretation Date/Time:    Ventricular Rate:    PR Interval:    QRS Duration:    QT Interval:    QTC Calculation:   R Axis:      Text Interpretation:           Imaging Studies  ordered: I ordered imaging studies including CT head, CT C-spine, CT maxillofacial, chest x-ray, right wrist x-ray I independently visualized and interpreted imaging. I agree with the radiologist interpretation   Medicines ordered and prescription drug management: Meds ordered this encounter  Medications   acetaminophen  (TYLENOL ) tablet 1,000 mg   lidocaine -EPINEPHrine  (XYLOCAINE  W/EPI) 2 %-1:200000 (PF) injection 10 mL   lidocaine -EPINEPHrine -tetracaine  (LET) topical gel   cephALEXin  (KEFLEX ) 500 MG capsule    Sig: Take 1 capsule (500 mg total) by mouth 3 (three) times daily for 7 days.    Dispense:  20 capsule    Refill:  0    Only has rash from penicillins and not anaphylactic reaction or angioedema.    Supervising Provider:   CLEOTILDE ROGUE [[id number]]   oxyCODONE  (ROXICODONE ) 5 MG immediate release tablet    Sig: Take 1 tablet (5 mg total) by mouth every 4 (four) hours as needed for severe pain (pain score 7-10).    Dispense:  10 tablet    Refill:  0    Supervising Provider:   CLEOTILDE ROGUE [[id number]]    -I have reviewed the patients home medicines and have made adjustments as needed  Reevaluation: After the interventions noted above, I reevaluated the patient and found that they have :improved  Co morbidities that complicate the patient evaluation  Past Medical History:  Diagnosis Date   Anemia May 2023   Anxiety    Arthritis    Blood transfusion without reported diagnosis    Cancer (HCC)    Chronic back pain    Dizziness    GERD (gastroesophageal reflux disease)    Heart palpitations    Hypercholesterolemia    Hypertension    Lightheadedness    Multiple myeloma (HCC)    Neuromuscular disorder (HCC) Jan 2024   Peripheral Neuropathy   Obesity    PONV (postoperative nausea and vomiting)    Post-menopausal    at age 71   SOB (shortness of breath)    climbing stairs   Tachycardia       Dispostion: Discharged in stable condition.  Return precaution discussed.   Patient voices understanding.    Final diagnoses:  Fall, initial encounter  Concussion with unknown loss of consciousness status, initial encounter  Lip laceration, initial encounter  Facial laceration, initial encounter    ED Discharge Orders          Ordered    cephALEXin  (KEFLEX ) 500 MG capsule  3 times daily       Note to Pharmacy: Only has rash from penicillins and not anaphylactic reaction or angioedema.   10/16/23 1654    oxyCODONE  (ROXICODONE ) 5 MG immediate release tablet  Every 4 hours PRN        10/16/23 1657               Hildegard Loge, PA-C 10/16/23 1747    Armenta Canning, MD 10/30/23 2126

## 2023-11-05 ENCOUNTER — Other Ambulatory Visit: Payer: Self-pay | Admitting: Family Medicine

## 2023-11-05 DIAGNOSIS — F419 Anxiety disorder, unspecified: Secondary | ICD-10-CM

## 2024-02-08 ENCOUNTER — Other Ambulatory Visit: Payer: Self-pay | Admitting: Family Medicine

## 2024-02-08 DIAGNOSIS — F419 Anxiety disorder, unspecified: Secondary | ICD-10-CM
